# Patient Record
Sex: Male | Born: 1985 | Race: White | Hispanic: No | Marital: Married | Smoking: Current every day smoker
Health system: Southern US, Community
[De-identification: ages and names within clinical notes are randomized; demographics above are authoritative.]

## PROBLEM LIST (undated history)

## (undated) DIAGNOSIS — F32A Depression, unspecified: Secondary | ICD-10-CM

## (undated) DIAGNOSIS — J45909 Unspecified asthma, uncomplicated: Secondary | ICD-10-CM

## (undated) DIAGNOSIS — F329 Major depressive disorder, single episode, unspecified: Secondary | ICD-10-CM

## (undated) DIAGNOSIS — F419 Anxiety disorder, unspecified: Secondary | ICD-10-CM

## (undated) DIAGNOSIS — K219 Gastro-esophageal reflux disease without esophagitis: Secondary | ICD-10-CM

## (undated) HISTORY — DX: Unspecified asthma, uncomplicated: J45.909

## (undated) HISTORY — DX: Major depressive disorder, single episode, unspecified: F32.9

## (undated) HISTORY — DX: Anxiety disorder, unspecified: F41.9

## (undated) HISTORY — DX: Gastro-esophageal reflux disease without esophagitis: K21.9

## (undated) HISTORY — DX: Depression, unspecified: F32.A

---
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
## 2005-02-21 ENCOUNTER — Emergency Department: Payer: Self-pay | Admitting: Emergency Medicine

## 2005-06-19 ENCOUNTER — Emergency Department: Payer: Self-pay | Admitting: General Practice

## 2005-11-19 ENCOUNTER — Emergency Department: Payer: Self-pay | Admitting: Unknown Physician Specialty

## 2006-01-10 ENCOUNTER — Emergency Department: Payer: Self-pay | Admitting: Unknown Physician Specialty

## 2006-03-23 ENCOUNTER — Emergency Department: Payer: Self-pay | Admitting: General Practice

## 2006-09-15 ENCOUNTER — Emergency Department: Payer: Self-pay | Admitting: Internal Medicine

## 2007-03-14 ENCOUNTER — Emergency Department: Payer: Self-pay | Admitting: Emergency Medicine

## 2007-03-19 ENCOUNTER — Emergency Department: Payer: Self-pay | Admitting: Emergency Medicine

## 2008-06-22 ENCOUNTER — Emergency Department: Payer: Self-pay | Admitting: Emergency Medicine

## 2008-07-03 ENCOUNTER — Emergency Department: Payer: Self-pay | Admitting: Emergency Medicine

## 2009-04-29 ENCOUNTER — Emergency Department: Payer: Self-pay | Admitting: Emergency Medicine

## 2011-04-04 ENCOUNTER — Emergency Department: Payer: Self-pay | Admitting: Emergency Medicine

## 2012-06-21 ENCOUNTER — Emergency Department: Payer: Self-pay | Admitting: Emergency Medicine

## 2012-09-09 ENCOUNTER — Emergency Department: Payer: Self-pay | Admitting: Emergency Medicine

## 2013-05-06 ENCOUNTER — Emergency Department: Payer: Self-pay | Admitting: Emergency Medicine

## 2015-05-06 ENCOUNTER — Encounter: Payer: Self-pay | Admitting: Family Medicine

## 2015-05-06 ENCOUNTER — Ambulatory Visit
Admission: RE | Admit: 2015-05-06 | Discharge: 2015-05-06 | Disposition: A | Discharge status: Home / Self Care | Payer: No Typology Code available for payment source | Source: Ambulatory Visit | Attending: Family Medicine | Admitting: Family Medicine

## 2015-05-06 ENCOUNTER — Encounter (INDEPENDENT_AMBULATORY_CARE_PROVIDER_SITE_OTHER): Payer: Self-pay

## 2015-05-06 ENCOUNTER — Ambulatory Visit (INDEPENDENT_AMBULATORY_CARE_PROVIDER_SITE_OTHER): Payer: No Typology Code available for payment source | Admitting: Family Medicine

## 2015-05-06 VITALS — BP 116/66 | HR 84 | Temp 98.1°F | Resp 16 | Ht 71.0 in | Wt 175.2 lb

## 2015-05-06 DIAGNOSIS — M545 Low back pain, unspecified: Secondary | ICD-10-CM | POA: Insufficient documentation

## 2015-05-06 DIAGNOSIS — M5136 Other intervertebral disc degeneration, lumbar region: Secondary | ICD-10-CM | POA: Diagnosis not present

## 2015-05-06 DIAGNOSIS — J45909 Unspecified asthma, uncomplicated: Secondary | ICD-10-CM | POA: Insufficient documentation

## 2015-05-06 DIAGNOSIS — R41840 Attention and concentration deficit: Secondary | ICD-10-CM

## 2015-05-06 DIAGNOSIS — Z720 Tobacco use: Secondary | ICD-10-CM | POA: Insufficient documentation

## 2015-05-06 MED ORDER — CARISOPRODOL 350 MG PO TABS: 350.0000 mg | ORAL_TABLET | Freq: Every evening | ORAL | Status: DC | PRN

## 2015-05-06 MED ORDER — MELOXICAM 15 MG PO TABS: 15.0000 mg | ORAL_TABLET | Freq: Every day | ORAL | Status: DC

## 2015-05-06 NOTE — Progress Notes (Signed)
Name: Larry Bailey   MRN: [id number]    DOB: 1986-07-29   Date:05/06/2015       Progress Note  Subjective  Chief Complaint  Chief Complaint  Patient presents with  . Establish Care    patient states that his boss told him he was having a hard time following instructions.  . Back Pain    patient hurt his back about a month or so ago at work and he is still having some problems with it.     HPI  Joint/Muscle Pain: Patient complains of back pain for which has been present for a few months. Pain is located in mid to lower, is described as aching, and is intermittent .  Associated symptoms include: none.  The patient has NSAID and flexeril.  Related to injury:   Lifting heavy tires at work about 2 months ago, felt a twinge in his lower back right side more and since then having pain daily. No radiation, no weakness. Has not had any X-ray.   Focus Issues:  The condition is characterized as fidgeting, loses items necessary for activity, interrupting others, poor attention span, shifting from one uncompleted task to another and easily distracted but not excessive talking, difficulty remaining seated, difficulty waiting for his turn, engages in physically dangerous activities, difficulty waiting for his tern, blurting out answers before question is complete, difficulty following instructions or antisocial behavior. There has been no associated hearing difficulties, vision disturbances but does have a positive history of learning disability as a child. He has also had a hard time keeping jobs due to focus.     Patient Active Problem List   Diagnosis Date Noted  . 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 Current tobacco use 05/06/2015    History  Substance Use Topics  . Smoking status: Current Some Day Smoker    Types: Cigarettes  . Smokeless tobacco: Not on file  . Alcohol Use: No     Current outpatient prescriptions:  .  Acetaminophen 500 MG coapsule, Take by mouth., Disp: , Rfl:  .   cyclobenzaprine (FLEXERIL) 5 MG tablet, , Disp: , Rfl:  .  etodolac (LODINE) 400 MG tablet, , Disp: , Rfl:   History reviewed. No pertinent past surgical history.  Family History  Problem Relation Age of Onset  . Kidney disease Maternal Grandmother     kidney failure  . Diabetes Maternal Grandfather   . Cancer Maternal Grandfather     No Known Allergies   Review of Systems  CONSTITUTIONAL: No significant weight changes, fever, chills, weakness or fatigue.  HEENT:  - Eyes: No visual changes.  - Ears: No auditory changes. No pain.  - Nose: No sneezing, congestion, runny nose. - Throat: No sore throat. No changes in swallowing. SKIN: No rash or itching.  CARDIOVASCULAR: No chest pain, chest pressure or chest discomfort. No palpitations or edema.  RESPIRATORY: No shortness of breath, cough or sputum.  GASTROINTESTINAL: No anorexia, nausea, vomiting. No changes in bowel habits. No abdominal pain or blood.  GENITOURINARY: No dysuria. No frequency. No discharge. NEUROLOGICAL: No headache, dizziness, syncope, paralysis, ataxia, numbness or tingling in the extremities. No memory changes. No change in bowel or bladder control.  MUSCULOSKELETAL: Yes back pain. No muscle pain. HEMATOLOGIC: No anemia, bleeding or bruising.  LYMPHATICS: No enlarged lymph nodes.  PSYCHIATRIC: No change in mood. No change in sleep pattern. Problems with focus. ENDOCRINOLOGIC: No reports of sweating, cold or heat intolerance. No polyuria or polydipsia.  Objective  BP 116/66 mmHg  Pulse 84  Temp(Src) 98.1 F (36.7 C) (Oral)  Resp 16  Ht  (1.803 m)  Wt 175 lb 3.2 oz (79.47 kg)  BMI 24.45 kg/m2  SpO2 98% Body mass index is 24.45 kg/(m^2).  Physical Exam  Constitutional: Patient appears well-developed and well-nourished. In no distress.  HEENT:  - Head: Normocephalic and atraumatic.  - Ears: Bilateral TMs gray, no erythema or effusion - Nose: Nasal mucosa moist - Mouth/Throat:  Oropharynx is clear and moist. No tonsillar hypertrophy or erythema. No post nasal drainage.  - Eyes: Conjunctivae clear, EOM movements normal. PERRLA. No scleral icterus.  Neck: Normal range of motion. Neck supple. No JVD present. No thyromegaly present.  Cardiovascular: Normal rate, regular rhythm and normal heart sounds.  No murmur heard.  Pulmonary/Chest: Effort normal and breath sounds normal. No respiratory distress. Musculoskeletal: Normal range of motion bilateral UE and LE, no joint effusions. Lumbar spin no palpable step off or tenderness over spine however he does have right paraspinal muscle tension and tenderness. Peripheral vascular: Bilateral LE no edema. Neurological: CN II-XII grossly intact with no focal deficits. Alert and oriented to person, place, and time. Coordination, balance, strength, speech and gait are normal.  Skin: Skin is warm and dry. No rash noted. No erythema.  Psychiatric: Patient has a normal mood and affect. Behavior is normal in office today. Judgment and thought content normal in office today.   Assessment & Plan 1. Right-sided low back pain without sciatica Likely muscular. Instructed on home exercises and proper lifting techniques.   - DG Lumbar Spine Complete; Future - meloxicam (MOBIC) 15 MG tablet; Take 1 tablet (15 mg total) by mouth daily.  Dispense: 30 tablet; Refill: 1 - carisoprodol (SOMA) 350 MG tablet; Take 1 tablet (350 mg total) by mouth at bedtime as needed for muscle spasms.  Dispense: 30 tablet; Refill: 1  2. Attention deficit Will refer for appropriate evaluation of likely undiagnosed life long ADD. - Ambulatory referral to Psychology

## 2015-05-06 NOTE — Patient Instructions (Signed)

## 2015-05-07 ENCOUNTER — Encounter: Payer: Self-pay | Admitting: Family Medicine

## 2015-08-26 ENCOUNTER — Encounter: Payer: Self-pay | Admitting: Family Medicine

## 2015-08-26 ENCOUNTER — Ambulatory Visit (INDEPENDENT_AMBULATORY_CARE_PROVIDER_SITE_OTHER): Payer: Self-pay | Admitting: Family Medicine

## 2015-08-26 VITALS — BP 118/67 | HR 88 | Temp 98.1°F | Resp 18 | Ht 71.0 in | Wt 177.3 lb

## 2015-08-26 DIAGNOSIS — M545 Low back pain: Secondary | ICD-10-CM

## 2015-08-26 DIAGNOSIS — G8929 Other chronic pain: Secondary | ICD-10-CM

## 2015-08-26 MED ORDER — METAXALONE 800 MG PO TABS: 800.0000 mg | ORAL_TABLET | Freq: Three times a day (TID) | ORAL | Status: DC

## 2015-08-26 NOTE — Progress Notes (Signed)
Name: Larry Bailey   MRN: [id number]    DOB: Sep 01, 1986   Date:08/26/2015       Progress Note  Subjective  Chief Complaint  Chief Complaint  Patient presents with  . Back Pain    Right Side x6 months    Back Pain This is a chronic problem. Episode onset: 6 months ago. The problem occurs constantly. The problem has been waxing and waning (initially pain would come and go but for last 3-4 months, it is constant) since onset. The pain is present in the lumbar spine and gluteal. The pain does not radiate. The pain is at a severity of 10/10. The pain is severe. The symptoms are aggravated by standing, position and bending. Pertinent negatives include no bladder incontinence, bowel incontinence, fever, leg pain, numbness, paresis, tingling or weight loss. He has tried NSAIDs and muscle relaxant for the symptoms. The treatment provided no relief.    Past Medical History  Diagnosis Date  . Anxiety   . Asthma   . Depression   . GERD (gastroesophageal reflux disease)     History reviewed. No pertinent past surgical history.  Family History  Problem Relation Age of Onset  . Kidney disease Maternal Grandmother     kidney failure  . Diabetes Maternal Grandfather   . Cancer Maternal Grandfather     Social History   Social History  . Marital Status: Single    Spouse Name: N/A  . Number of Children: 0  . Years of Education: N/A   Occupational History  . 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 Smoking status: Current Some Day Smoker    Types: Cigarettes  . Smokeless tobacco: Not on file  . Alcohol Use: No  . Drug Use: No  . Sexual Activity:    Partners: Female    CopyBirth Control/ Protection: None   Other Topics Concern  . Not on file   Social History Narrative    Current outpatient prescriptions:  .  Acetaminophen 500 MG coapsule, Take by mouth., Disp: , Rfl:  .  carisoprodol (SOMA) 350 MG tablet, Take 1 tablet (350 mg total) by mouth at bedtime as  needed for muscle spasms., Disp: 30 tablet, Rfl: 1 .  cyclobenzaprine (FLEXERIL) 5 MG tablet, , Disp: , Rfl:  .  etodolac (LODINE) 400 MG tablet, , Disp: , Rfl:  .  meloxicam (MOBIC) 15 MG tablet, Take 1 tablet (15 mg total) by mouth daily. (Patient not taking: Reported on 08/26/2015), Disp: 30 tablet, Rfl: 1  No Known Allergies   Review of Systems  Constitutional: Negative for fever, chills, weight loss and malaise/fatigue.  Gastrointestinal: Negative for bowel incontinence.  Genitourinary: Negative for bladder incontinence.  Musculoskeletal: Positive for back pain.  Neurological: Negative for tingling and numbness.    Objective  Filed Vitals:   08/26/15 1002  BP: 118/67  Pulse: 88  Temp: 98.1 F (36.7 C)  TempSrc: Oral  Resp: 18  Height: 5\' 11"  (1.803 m)  Weight: 177 lb 4.8 oz (80.423 kg)  SpO2: 97%    Physical Exam  Constitutional: He is well-developed, well-nourished, and in no distress.  Cardiovascular: Normal rate, regular rhythm and normal heart sounds.   Pulmonary/Chest: Effort normal and breath sounds normal.  Musculoskeletal:       Lumbar back: He exhibits tenderness, pain and spasm.  Nursing note and vitals reviewed.   Assessment & Plan  1. Chronic right-sided low back pain without sciatica  Right-sided lumbosacral pain and spasm, unresponsive to NSAID therapy. X-rays reviewed. We'll start on Skelaxin 800 mg 3 times a day for relief and refer to spine specialist. - metaxalone (SKELAXIN) 800 MG tablet; Take 1 tablet (800 mg total) by mouth 3 (three) times daily.  Dispense: 30 tablet; Refill: 0 - Ambulatory referral to Neurosurgery   [facility] 08/26/2015 10:14 AM

## 2017-01-03 ENCOUNTER — Encounter: Payer: Self-pay | Admitting: Family Medicine

## 2017-01-03 ENCOUNTER — Ambulatory Visit (INDEPENDENT_AMBULATORY_CARE_PROVIDER_SITE_OTHER): Payer: Managed Care, Other (non HMO) | Admitting: Family Medicine

## 2017-01-03 VITALS — BP 124/76 | HR 100 | Temp 97.7°F | Resp 16 | Ht 71.0 in | Wt 190.3 lb

## 2017-01-03 DIAGNOSIS — G8929 Other chronic pain: Secondary | ICD-10-CM | POA: Diagnosis not present

## 2017-01-03 DIAGNOSIS — M545 Low back pain, unspecified: Secondary | ICD-10-CM

## 2017-01-03 MED ORDER — NAPROXEN 500 MG PO TABS: 500.0000 mg | ORAL_TABLET | Freq: Two times a day (BID) | ORAL | 0 refills | Status: DC

## 2017-01-03 MED ORDER — TIZANIDINE HCL 2 MG PO TABS: 2.0000 mg | ORAL_TABLET | Freq: Three times a day (TID) | ORAL | 0 refills | Status: DC | PRN

## 2017-01-03 NOTE — Progress Notes (Signed)
Name: Larry Bailey   MRN: [id number]    DOB: [birth year]   Date:01/03/2017       Progress Note  Subjective  Chief Complaint  Chief Complaint  Patient presents with  . Back Pain    recurrent low back pain    Back Pain  This is a recurrent problem. The current episode started more than 1 year ago (aproximately 2 years ago, believes he was hurt at work while picking up Intelaheavy object, since then has recurrent lower back pain). The pain is present in the lumbar spine. The quality of the pain is described as shooting. The pain does not radiate. The pain is at a severity of 9/10. The symptoms are aggravated by position and standing (prolonged standing makes it worse). Pertinent negatives include no abdominal pain, bladder incontinence, bowel incontinence, leg pain, numbness, perianal numbness or weakness. He has tried muscle relaxant and NSAIDs (referred to a back specialist but was not able to see them due to work) for the symptoms. The treatment provided no relief.    Past Medical History:  Diagnosis Date  . Anxiety   . Asthma   . Depression   . GERD (gastroesophageal reflux disease)     No past surgical history on file.  Family History  Problem Relation Age of Onset  . Kidney disease Maternal Grandmother     kidney failure  . Diabetes Maternal Grandfather   . Cancer Maternal Grandfather     Social History   Social History  . Marital status: Single    Spouse name: N/A  . Number of children: 0  . Years of education: N/A   Occupational History  . 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 Smoking status: Current Every Day Smoker    Types: Cigarettes  . Smokeless tobacco: Never Used  . Alcohol use No  . Drug use: No  . Sexual activity: Yes    Partners: Female    Birth control/ protection: None   Other Topics Concern  . Not on file   Social History Narrative  . No narrative on file    No current outpatient prescriptions on file.  No Known  Allergies   Review of Systems  Gastrointestinal: Negative for abdominal pain and bowel incontinence.  Genitourinary: Negative for bladder incontinence.  Musculoskeletal: Positive for back pain.  Neurological: Negative for weakness and numbness.     Objective  Vitals:   01/03/17 0951  BP: 124/76  Pulse: 100  Resp: 16  Temp: 97.7 F (36.5 C)  TempSrc: Oral  SpO2: 97%  Weight: 190 lb 4.8 oz (86.3 kg)  Height: 5\' 11"  (1.803 m)    Physical Exam  Constitutional: He is well-developed, well-nourished, and in no distress.  Musculoskeletal:       Lumbar back: He exhibits tenderness, pain and spasm.       Back:  Nursing note and vitals reviewed.     Assessment & Plan  1. Chronic midline low back pain without sciatica Started on high-dose NSAID therapy along with muscle relaxants, obtain repeat lumbar spine x-ray, referral to physical therapy for management - naproxen (NAPROSYN) 500 MG tablet; Take 1 tablet (500 mg total) by mouth 2 (two) times daily with a meal.  Dispense: 20 tablet; Refill: 0 - tiZANidine (ZANAFLEX) 2 MG tablet; Take 1 tablet (2 mg total) by mouth every 8 (eight) hours as needed for muscle spasms.  Dispense: 30 tablet; Refill: 0 - DG Lumbar  Spine Complete; Future - Ambulatory referral to Physical Therapy   Mylen Mangan [facility] 01/03/2017 10:07 AM

## 2017-02-09 ENCOUNTER — Ambulatory Visit (INDEPENDENT_AMBULATORY_CARE_PROVIDER_SITE_OTHER): Payer: Managed Care, Other (non HMO) | Admitting: Family Medicine

## 2017-02-09 ENCOUNTER — Encounter: Payer: Self-pay | Admitting: Family Medicine

## 2017-02-09 VITALS — BP 124/82 | HR 102 | Temp 98.0°F | Resp 18 | Ht 71.0 in | Wt 187.2 lb

## 2017-02-09 DIAGNOSIS — Z0000 Encounter for general adult medical examination without abnormal findings: Secondary | ICD-10-CM | POA: Diagnosis not present

## 2017-02-09 DIAGNOSIS — Z720 Tobacco use: Secondary | ICD-10-CM | POA: Diagnosis not present

## 2017-02-09 NOTE — Progress Notes (Signed)
Name: Larry Bailey   MRN: [id number]    DOB: 06-Nov-1985   Date:02/09/2017       Progress Note  Subjective  Chief Complaint  Chief Complaint  Patient presents with  . Annual Exam    Nicotine Dependence  Presents for initial visit. Symptoms include cravings. Symptoms are negative for insomnia and sore throat. Preferred tobacco types include cigarettes. Preferred strength is regular. Preferred brands include Marlboro. His urge triggers include company of smokers, driving, meal time and stress. His first smoke is from 8 to 10 AM. He smokes 1 pack of cigarettes per day. He started smoking when he was 80-87 years old. Past treatments include nothing. Compliance with prior treatments has been good. Larry Bailey is thinking about quitting. Larry Bailey has tried to quit 0 times. There is no history of alcohol abuse and drug use.    Pt. Presents for Complete Physical Exam.    Past Medical History:  Diagnosis Date  . Anxiety   . Asthma   . Depression   . GERD (gastroesophageal reflux disease)     No past surgical history on file.  Family History  Problem Relation Age of Onset  . Kidney disease Maternal Grandmother     kidney failure  . Diabetes Maternal Grandfather   . Cancer Maternal Grandfather     Social History   Social History  . Marital status: Single    Spouse name: N/A  . Number of children: 0  . Years of education: N/A   Occupational History  . Lube Tech     [employer]   Social History Main Topics  . Smoking status: Current Every Day Smoker    Types: Cigarettes  . Smokeless tobacco: Never Used  . Alcohol use No  . Drug use: No  . Sexual activity: Yes    Partners: Female    Birth control/ protection: None   Other Topics Concern  . Not on file   Social History Narrative  . No narrative on file     Current Outpatient Prescriptions:  .  naproxen (NAPROSYN) 500 MG tablet, Take 1 tablet (500 mg total) by mouth 2 (two) times daily with a meal., Disp: 20 tablet,  Rfl: 0 .  tiZANidine (ZANAFLEX) 2 MG tablet, Take 1 tablet (2 mg total) by mouth every 8 (eight) hours as needed for muscle spasms., Disp: 30 tablet, Rfl: 0  No Known Allergies   Review of Systems  Constitutional: Negative for chills, fever, malaise/fatigue and weight loss.  HENT: Negative for congestion, ear pain and sore throat.   Eyes: Negative for blurred vision and double vision.  Respiratory: Positive for cough. Negative for sputum production and shortness of breath.   Cardiovascular: Negative for chest pain.  Gastrointestinal: Negative for blood in stool, constipation, diarrhea, melena, nausea and vomiting.  Genitourinary: Negative for dysuria and hematuria.  Musculoskeletal: Positive for back pain.  Neurological: Negative for dizziness and headaches.  Psychiatric/Behavioral: Negative for depression. The patient is not nervous/anxious and does not have insomnia.     Objective  Vitals:   02/09/17 0913  BP: 124/82  Pulse: (!) 102  Resp: 18  Temp: 98 F (36.7 C)  TempSrc: Oral  SpO2: 99%  Weight: 187 lb 3.2 oz (84.9 kg)  Height:  (1.803 m)    Physical Exam  Constitutional: He is oriented to person, place, and time and well-developed, well-nourished, and in no distress.  HENT:  Head: Normocephalic and atraumatic.  Right Ear: External ear normal.  Left Ear:  External ear normal.  Eyes: Pupils are equal, round, and reactive to light.  Cardiovascular: Normal rate, regular rhythm and normal heart sounds.   No murmur heard. Pulmonary/Chest: Effort normal. He has no wheezes.  Abdominal: Soft. Bowel sounds are normal. There is no tenderness.  Musculoskeletal: He exhibits no edema.  Neurological: He is alert and oriented to person, place, and time.  Psychiatric: Mood, memory, affect and judgment normal.  Nursing note and vitals reviewed.      Assessment & Plan  1. Annual physical exam We'll obtain age appropriate lab work - CBC with Differential/Platelet -  COMPLETE METABOLIC PANEL WITH GFR - Lipid panel - TSH - VITAMIN D 25 Hydroxy (Vit-D Deficiency, Fractures)  2. Current tobacco use Patient is contemplating quitting but is not ready at this time, we discussed nicotine replacement versus Zyban to help with smoking cessation, patient will schedule an appointment when he is ready.   Belanna Manring Larry Bailey A. [facility] 02/09/2017 9:17 AM

## 2017-02-10 LAB — CBC WITH DIFFERENTIAL/PLATELET
BASOS PCT: 1 %
Basophils Absolute: 59 cells/uL (ref 0–200)
EOS ABS: 236 {cells}/uL (ref 15–500)
Eosinophils Relative: 4 %
HCT: 51.5 % — ABNORMAL HIGH (ref 38.5–50.0)
Hemoglobin: 17.6 g/dL — ABNORMAL HIGH (ref 13.2–17.1)
LYMPHS ABS: 2596 {cells}/uL (ref 850–3900)
LYMPHS PCT: 44 %
MCH: 32.7 pg (ref 27.0–33.0)
MCHC: 34.2 g/dL (ref 32.0–36.0)
MCV: 95.7 fL (ref 80.0–100.0)
MONOS PCT: 10 %
MPV: 9.3 fL (ref 7.5–12.5)
Monocytes Absolute: 590 cells/uL (ref 200–950)
NEUTROS ABS: 2419 {cells}/uL (ref 1500–7800)
Neutrophils Relative %: 41 %
PLATELETS: 162 10*3/uL (ref 140–400)
RBC: 5.38 MIL/uL (ref 4.20–5.80)
RDW: 13.2 % (ref 11.0–15.0)
WBC: 5.9 10*3/uL (ref 3.8–10.8)

## 2017-02-10 LAB — LIPID PANEL
CHOLESTEROL: 117 mg/dL (ref <200)
HDL: 36 mg/dL — ABNORMAL LOW (ref >40)
LDL Cholesterol: 64 mg/dL (ref <100)
Total CHOL/HDL Ratio: 3.3 Ratio (ref <5.0)
Triglycerides: 83 mg/dL (ref <150)
VLDL: 17 mg/dL (ref <30)

## 2017-02-10 LAB — COMPLETE METABOLIC PANEL WITH GFR
AG Ratio: 2.2 Ratio (ref 1.0–2.5)
ALBUMIN: 4.3 g/dL (ref 3.6–5.1)
ALT: 42 U/L (ref 9–46)
AST: 35 U/L (ref 10–40)
Alkaline Phosphatase: 61 U/L (ref 40–115)
BILIRUBIN TOTAL: 0.8 mg/dL (ref 0.2–1.2)
BUN / CREAT RATIO: 11.5 ratio (ref 6–22)
BUN: 10 mg/dL (ref 7–25)
CALCIUM: 9.3 mg/dL (ref 8.6–10.3)
CHLORIDE: 104 mmol/L (ref 98–110)
CO2: 31 mmol/L (ref 20–31)
CREATININE: 0.87 mg/dL (ref 0.60–1.35)
GFR, Est African American: >89 mL/min (ref >=60)
GFR, Est Non African American: >89 mL/min (ref >=60)
GLOBULIN: 2 g/dL (ref 1.9–3.7)
Glucose, Bld: 95 mg/dL (ref 65–99)
Potassium: 5.1 mmol/L (ref 3.5–5.3)
Sodium: 140 mmol/L (ref 135–146)
Total Protein: 6.3 g/dL (ref 6.1–8.1)

## 2017-02-10 LAB — TSH: TSH: 2.91 mIU/L (ref 0.40–4.50)

## 2017-02-11 LAB — VITAMIN D 25 HYDROXY (VIT D DEFICIENCY, FRACTURES): Vit D, 25-Hydroxy: 41 ng/mL (ref 30–100)

## 2017-02-27 ENCOUNTER — Encounter: Payer: Self-pay | Admitting: Family Medicine

## 2020-09-22 ENCOUNTER — Other Ambulatory Visit: Payer: Self-pay

## 2020-09-22 NOTE — Progress Notes (Signed)
Pre-employment instant urine drug screen collected.  Negative.

## 2020-10-19 DIAGNOSIS — R69 Illness, unspecified: Secondary | ICD-10-CM | POA: Diagnosis not present

## 2020-11-27 ENCOUNTER — Ambulatory Visit: Payer: Self-pay

## 2020-11-27 ENCOUNTER — Other Ambulatory Visit: Payer: Self-pay

## 2020-11-27 DIAGNOSIS — Z0110 Encounter for examination of ears and hearing without abnormal findings: Secondary | ICD-10-CM

## 2020-12-01 NOTE — Progress Notes (Signed)
Works for [employer]. Department in [employer] program & works in a position that requires an annual hearing screen.  Larry Bailey is a new employee & today's test is to establish a baseline.  AMD

## 2020-12-03 DIAGNOSIS — R69 Illness, unspecified: Secondary | ICD-10-CM | POA: Diagnosis not present

## 2020-12-25 DIAGNOSIS — R69 Illness, unspecified: Secondary | ICD-10-CM | POA: Diagnosis not present

## 2021-01-26 DIAGNOSIS — R69 Illness, unspecified: Secondary | ICD-10-CM | POA: Diagnosis not present

## 2021-02-22 DIAGNOSIS — R69 Illness, unspecified: Secondary | ICD-10-CM | POA: Diagnosis not present

## 2021-03-22 DIAGNOSIS — R69 Illness, unspecified: Secondary | ICD-10-CM | POA: Diagnosis not present

## 2021-04-15 ENCOUNTER — Encounter: Payer: Managed Care, Other (non HMO) | Admitting: Unknown Physician Specialty

## 2021-04-20 DIAGNOSIS — R69 Illness, unspecified: Secondary | ICD-10-CM | POA: Diagnosis not present

## 2021-05-17 DIAGNOSIS — R69 Illness, unspecified: Secondary | ICD-10-CM | POA: Diagnosis not present

## 2021-06-17 DIAGNOSIS — R69 Illness, unspecified: Secondary | ICD-10-CM | POA: Diagnosis not present

## 2021-07-19 DIAGNOSIS — R69 Illness, unspecified: Secondary | ICD-10-CM | POA: Diagnosis not present

## 2021-08-19 DIAGNOSIS — R69 Illness, unspecified: Secondary | ICD-10-CM | POA: Diagnosis not present

## 2021-09-15 DIAGNOSIS — R69 Illness, unspecified: Secondary | ICD-10-CM | POA: Diagnosis not present

## 2021-10-06 DIAGNOSIS — R69 Illness, unspecified: Secondary | ICD-10-CM | POA: Diagnosis not present

## 2021-10-07 ENCOUNTER — Ambulatory Visit: Payer: Self-pay | Admitting: Physician Assistant

## 2021-10-07 ENCOUNTER — Encounter: Payer: Self-pay | Admitting: Physician Assistant

## 2021-10-07 ENCOUNTER — Other Ambulatory Visit: Payer: Self-pay

## 2021-10-07 DIAGNOSIS — M7712 Lateral epicondylitis, left elbow: Secondary | ICD-10-CM

## 2021-10-07 MED ORDER — NAPROXEN 500 MG PO TABS: 500.0000 mg | ORAL_TABLET | Freq: Two times a day (BID) | ORAL | 0 refills | Status: DC

## 2021-10-07 NOTE — Progress Notes (Signed)
Left Elbow pain x1 year - intermittent Started back a couple months ago No known injury Hasn't seen anyone for the elbow pain No OTC meds, heat or ice or topical meds tried Thinks there is a know there that's not on the right elbow. Gets stiff if keeps in one position for extended amount of time No numbness or tingling in fingers Pain localized to elbow area - no radiation towards shoulder or hands Repetitive work No sports like golf, tennis or softball  AMD

## 2021-10-07 NOTE — Progress Notes (Signed)
[facility]     ____________________________________________   None    (approximate)  I have reviewed the triage vital signs and the nursing notes.   HISTORY  Chief Complaint Elbow Pain (Left)    HPI Larry Bailey is a 35 y.o. male patient complain of 1 year lateral elbow pain.  No provocative significant except for repetitive motion required by his job.  Denies loss of sensation.  Patient pain increases rates pain as 5/10.  Described pain as "patient states similar complaint in right dominant elbow few years ago.      Past Medical History:  Diagnosis Date   Anxiety    Asthma    Depression    GERD (gastroesophageal reflux disease)     Patient Active Problem List   Diagnosis Date Noted   Annual physical exam 02/09/2017   Chronic midline low back pain without sciatica 08/26/2015   Airway hyperreactivity 05/06/2015   Current tobacco use 05/06/2015   Lumbar back pain 05/06/2015   Attention deficit 05/06/2015    History reviewed. No pertinent surgical history.  Prior to Admission medications   Medication Sig Start Date End Date Taking? Authorizing Provider  buprenorphine-naloxone (SUBOXONE) 8-2 mg SUBL SL tablet 1 tablet 2 (two) times daily. 09/27/21  Yes [provider]  naproxen (NAPROSYN) 500 MG tablet Take 1 tablet (500 mg total) by mouth 2 (two) times daily with a meal. 01/03/17   Ellyn Hack, MD  tiZANidine (ZANAFLEX) 2 MG tablet Take 1 tablet (2 mg total) by mouth every 8 (eight) hours as needed for muscle spasms. 01/03/17   Ellyn Hack, MD    Allergies Patient has no known allergies.  Family History  Problem Relation Age of Onset   Kidney disease Maternal Grandmother        kidney failure   Diabetes Maternal Grandfather    Cancer Maternal Grandfather     Social History Social History   Tobacco Use   Smoking status: Every Day    Types: Cigarettes   Smokeless tobacco: Never  Substance Use Topics   Alcohol use: No     Alcohol/week: 0.0 standard drinks   Drug use: No    Review of Systems Constitutional: No fever/chills Eyes: No visual changes. ENT: No sore throat. Cardiovascular: Denies chest pain. Respiratory: Denies shortness of breath. Gastrointestinal: No abdominal pain.  No nausea, no vomiting.  No diarrhea.  No constipation. Genitourinary: Negative for dysuria. Musculoskeletal: Negative for back pain. Skin: Negative for rash. Neurological: Negative for headaches, focal weakness or numbness. Psychiatric: Anxiety and depression ____________________________________________   PHYSICAL EXAM:  VITAL SIGNS:  Today's Problems  Elbow Pain Left    Select Method of Visit   Review Patient History   Go to Quick History Navigator   Today's Vitals  Date and Time Temp Pulse RR BP SpO2 Weight BMI Who  10/07/21 0852 98 F (36.7 C) 74 12 130/82 98 % 214 lb (97.1 kg) 29.8 AMD    See historical vitals   Today's Labs & Orders  None   Medications           buprenorphine-naloxone (SUBOXONE) 8-2 mg SUBL SL tablet 1 tablet 2 (two) times daily.  naproxen (NAPROSYN) 500 MG tablet Take 1 tablet (500 mg total) by mouth 2 (two) times daily with a meal.  tiZANidine (ZANAFLEX) 2 MG tablet Take 1 tablet (2 mg total) by mouth every 8 (eight) hours as needed for muscle spasms.     Mark as Reviewed  Reviewed by Gardner Candle, RN at 9:02 AM EST  Jump to medication reconciliation   Health Maintenance due or due soon     Topic Due Completion Date  COVID-19 Vaccine (1) Never done ---  Pneumococcal Vaccine 72-44 Years old (1 - PCV) Never done ---  HIV Screening Never done ---  Hepatitis C Screening Never done ---  TETANUS/TDAP Never done ---  INFLUENZA VACCINE Never done ---        BestPractice Advisories  Influenza vaccine due. Order the immunization through the SmartSet, document historical immunizations in the Immunizations activity, use Postpone to document a reason for not immunizing today,  or add the exclusion modifier to permanently remove from influenza plan.    If patient allergic to the vaccine, use hyperlink below to document allergies and add HM modifier "Not a candidate for influenza".  If patient declines or is ill use Postpone to set new date for vaccination reminder. To decline for the season, select Postpone and set date to 01/14/2022(end date of Influenza Plan).   Additional Information Follow-ups   Immunizations - Document Historical Immunization   CDC Immunization Guidelines Open SmartSet: Influenza Vaccine HM postpone: INFLUENZA VACCINE Add HM modifier: Not a candidate for influenza vaccine          Current tobacco user: with no documented counseling in past two years  Metrics: Intervention Frequency ACO  Documented Smoking Status Yearly  Screened one or more times in 24 months  Cessation Counseling or  Active cessation medication Past 24 months  Past 24 months    Guideline developer: UpToDate (See UpToDate for funding source) Date Released: 2014     Additional Information Follow-ups   History activity to update smoking history   Meds and Orders   UpToDate Smoking Cessation Open SmartSet: SMOKING CESSATION Initiate: Quit smoking          This patient is 18+ with a BMI > or = 25 indicating the patient is overweight. Please document your clinical recommendations and intended follow up now.    Estimated body mass index is 29.85 kg/m as calculated from the following:   Height as of this encounter: 5\' 11"  (1.803 m).   Weight as of this encounter: 214 lb (97.1 kg).     Additional Information Follow-ups   Document Follow Up Option/Contraindication Open SmartSet: [facility] AMB Qual Weight Management       Constitutional: Alert and oriented. Well appearing and in no acute distress. Cardiovascular: Normal rate, regular rhythm. Grossly normal heart sounds.  Good peripheral circulation. Respiratory: Normal respiratory effort.  No retractions. Lungs  CTAB. Musculoskeletal: No obvious deformity of the left elbow.  Patient full Nikkel range of motion.  Patient has moderate guarding palpation of the lateral epicondyle area. Neurologic:  Normal speech and language. No gross focal neurologic deficits are appreciated. No gait instability. Skin:  Skin is warm, dry and intact. No rash noted. Psychiatric: Mood and affect are normal. Speech and behavior are normal.  ____________________________________________      ____________________________________________   PROCEDURES  Procedure(s) performed (including Critical Care):  Procedures   ____________________________________________   INITIAL IMPRESSION / ASSESSMENT AND PLAN  Left elbow pain secondary to lateral epicondylitis.  Discussed sequela of complaint.  Patient placed in a tennis elbow strap and given a prescription for naproxen to take twice a day for 10 days.  Patient advised follow-up in 2 weeks if no improvement or worsening complaint.             ____________________________________________  ED Discharge Orders     None        Note:  This document was prepared using Dragon voice recognition software and may include unintentional dictation errors.

## 2021-11-02 DIAGNOSIS — R69 Illness, unspecified: Secondary | ICD-10-CM | POA: Diagnosis not present

## 2021-11-30 DIAGNOSIS — R69 Illness, unspecified: Secondary | ICD-10-CM | POA: Diagnosis not present

## 2021-12-23 DIAGNOSIS — R69 Illness, unspecified: Secondary | ICD-10-CM | POA: Diagnosis not present

## 2022-01-27 DIAGNOSIS — R69 Illness, unspecified: Secondary | ICD-10-CM | POA: Diagnosis not present

## 2022-02-23 DIAGNOSIS — R69 Illness, unspecified: Secondary | ICD-10-CM | POA: Diagnosis not present

## 2022-03-02 ENCOUNTER — Encounter: Payer: Self-pay | Admitting: Physician Assistant

## 2022-03-02 ENCOUNTER — Ambulatory Visit
Admission: RE | Admit: 2022-03-02 | Discharge: 2022-03-02 | Disposition: A | Discharge status: Home / Self Care | Payer: No Typology Code available for payment source | Source: Ambulatory Visit | Attending: Physician Assistant | Admitting: Physician Assistant

## 2022-03-02 ENCOUNTER — Ambulatory Visit
Admission: RE | Admit: 2022-03-02 | Discharge: 2022-03-02 | Disposition: A | Discharge status: Home / Self Care | Payer: No Typology Code available for payment source | Attending: Physician Assistant | Admitting: Physician Assistant

## 2022-03-02 ENCOUNTER — Ambulatory Visit: Payer: Self-pay | Admitting: Physician Assistant

## 2022-03-02 VITALS — BP 149/92 | HR 70 | Temp 99.5°F | Resp 16 | Ht 71.0 in | Wt 200.0 lb

## 2022-03-02 DIAGNOSIS — M7712 Lateral epicondylitis, left elbow: Secondary | ICD-10-CM

## 2022-03-02 DIAGNOSIS — M79641 Pain in right hand: Secondary | ICD-10-CM | POA: Insufficient documentation

## 2022-03-02 MED ORDER — NAPROXEN 500 MG PO TABS: 500.0000 mg | ORAL_TABLET | Freq: Two times a day (BID) | ORAL | 0 refills | Status: DC
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
## 2022-03-03 ENCOUNTER — Ambulatory Visit: Payer: Self-pay | Admitting: Physician Assistant

## 2022-03-03 ENCOUNTER — Encounter: Payer: Self-pay | Admitting: Physician Assistant

## 2022-03-03 VITALS — BP 146/83 | HR 71 | Temp 98.9°F | Resp 16

## 2022-03-03 DIAGNOSIS — S63621D Sprain of interphalangeal joint of right thumb, subsequent encounter: Secondary | ICD-10-CM

## 2022-03-03 NOTE — Progress Notes (Signed)
States Right thumb/hand some better today but still sore  AMD
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
## 2022-03-07 ENCOUNTER — Ambulatory Visit: Payer: Self-pay | Admitting: Physician Assistant

## 2022-03-07 ENCOUNTER — Encounter: Payer: Self-pay | Admitting: Physician Assistant

## 2022-03-07 VITALS — BP 129/94 | HR 74 | Resp 14 | Ht 71.0 in | Wt 205.0 lb

## 2022-03-07 DIAGNOSIS — S63621D Sprain of interphalangeal joint of right thumb, subsequent encounter: Secondary | ICD-10-CM

## 2022-03-07 NOTE — Progress Notes (Signed)
Pt presents today for follow up on right hand. Pt states its feeling better.
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
## 2022-03-23 ENCOUNTER — Ambulatory Visit: Payer: Self-pay

## 2022-03-23 DIAGNOSIS — Z0000 Encounter for general adult medical examination without abnormal findings: Secondary | ICD-10-CM

## 2022-03-23 LAB — POCT URINALYSIS DIPSTICK
Bilirubin, UA: NEGATIVE
Blood, UA: NEGATIVE
Glucose, UA: NEGATIVE
Ketones, UA: NEGATIVE
Leukocytes, UA: NEGATIVE
Nitrite, UA: NEGATIVE
Protein, UA: POSITIVE — AB
Spec Grav, UA: 1.02 (ref 1.010–1.025)
Urobilinogen, UA: 0.2 E.U./dL
pH, UA: 6.5 (ref 5.0–8.0)

## 2022-03-23 NOTE — Progress Notes (Signed)
Pt presents today for physical labs, will return to clinic for scheduled physical.  

## 2022-03-24 LAB — CMP12+LP+TP+TSH+6AC+CBC/D/PLT
ALT: 47 IU/L — ABNORMAL HIGH (ref 0–44)
AST: 35 IU/L (ref 0–40)
Albumin/Globulin Ratio: 2.3 — ABNORMAL HIGH (ref 1.2–2.2)
Albumin: 5.1 g/dL — ABNORMAL HIGH (ref 4.0–5.0)
Alkaline Phosphatase: 85 IU/L (ref 44–121)
BUN/Creatinine Ratio: 20 (ref 9–20)
BUN: 16 mg/dL (ref 6–20)
Basophils Absolute: 0.1 10*3/uL (ref 0.0–0.2)
Basos: 1 %
Bilirubin Total: 2.1 mg/dL — ABNORMAL HIGH (ref 0.0–1.2)
Calcium: 10 mg/dL (ref 8.7–10.2)
Chloride: 99 mmol/L (ref 96–106)
Chol/HDL Ratio: 5 ratio (ref 0.0–5.0)
Cholesterol, Total: 165 mg/dL (ref 100–199)
Creatinine, Ser: 0.81 mg/dL (ref 0.76–1.27)
EOS (ABSOLUTE): 0.5 10*3/uL — ABNORMAL HIGH (ref 0.0–0.4)
Eos: 6 %
Estimated CHD Risk: 1 times avg. (ref 0.0–1.0)
Free Thyroxine Index: 2.1 (ref 1.2–4.9)
GGT: 103 IU/L — ABNORMAL HIGH (ref 0–65)
Globulin, Total: 2.2 g/dL (ref 1.5–4.5)
Glucose: 81 mg/dL (ref 70–99)
HDL: 33 mg/dL — ABNORMAL LOW (ref >39)
Hematocrit: 50.7 % (ref 37.5–51.0)
Hemoglobin: 17.7 g/dL (ref 13.0–17.7)
Immature Grans (Abs): 0 10*3/uL (ref 0.0–0.1)
Immature Granulocytes: 0 %
Iron: 228 ug/dL — ABNORMAL HIGH (ref 38–169)
LDH: 267 IU/L — ABNORMAL HIGH (ref 121–224)
LDL Chol Calc (NIH): 97 mg/dL (ref 0–99)
Lymphocytes Absolute: 3.5 10*3/uL — ABNORMAL HIGH (ref 0.7–3.1)
Lymphs: 47 %
MCH: 32.5 pg (ref 26.6–33.0)
MCHC: 34.9 g/dL (ref 31.5–35.7)
MCV: 93 fL (ref 79–97)
Monocytes Absolute: 0.6 10*3/uL (ref 0.1–0.9)
Monocytes: 8 %
Neutrophils Absolute: 2.9 10*3/uL (ref 1.4–7.0)
Neutrophils: 38 %
Phosphorus: 3.2 mg/dL (ref 2.8–4.1)
Platelets: 161 10*3/uL (ref 150–450)
Potassium: 4.1 mmol/L (ref 3.5–5.2)
RBC: 5.44 x10E6/uL (ref 4.14–5.80)
RDW: 12.5 % (ref 11.6–15.4)
Sodium: 138 mmol/L (ref 134–144)
T3 Uptake Ratio: 25 % (ref 24–39)
T4, Total: 8.5 ug/dL (ref 4.5–12.0)
TSH: 2.49 u[IU]/mL (ref 0.450–4.500)
Total Protein: 7.3 g/dL (ref 6.0–8.5)
Triglycerides: 204 mg/dL — ABNORMAL HIGH (ref 0–149)
Uric Acid: 4.1 mg/dL (ref 3.8–8.4)
VLDL Cholesterol Cal: 35 mg/dL (ref 5–40)
WBC: 7.5 10*3/uL (ref 3.4–10.8)
eGFR: 118 mL/min/{1.73_m2} (ref >59)

## 2022-03-30 ENCOUNTER — Ambulatory Visit: Payer: Self-pay | Admitting: Physician Assistant

## 2022-03-30 ENCOUNTER — Encounter: Payer: Self-pay | Admitting: Physician Assistant

## 2022-03-30 VITALS — BP 141/89 | HR 70 | Temp 98.4°F | Resp 14 | Ht 71.0 in | Wt 205.0 lb

## 2022-03-30 DIAGNOSIS — Z0000 Encounter for general adult medical examination without abnormal findings: Secondary | ICD-10-CM

## 2022-03-30 DIAGNOSIS — Z716 Tobacco abuse counseling: Secondary | ICD-10-CM

## 2022-03-30 MED ORDER — CHANTIX STARTING MONTH PAK 0.5 MG X 11 & 1 MG X 42 PO TBPK
0.5000 | ORAL_TABLET | Freq: Every day | ORAL | 0 refills | Status: DC
Start: 2022-03-30 — End: 2022-05-05

## 2022-03-30 MED ORDER — ORPHENADRINE CITRATE ER 100 MG PO TB12: 100.0000 mg | ORAL_TABLET | Freq: Two times a day (BID) | ORAL | 0 refills | Status: DC

## 2022-03-30 NOTE — Progress Notes (Signed)
[facility]  ____________________________________________   None    (approximate)  I have reviewed the triage vital signs and the nursing notes.   HISTORY  Chief Complaint No chief complaint on file.    HPI Larry Bailey is a 36 y.o. male patient presents for annual physical exam.  Patient complaint of low back pain secondary to overexertion.  Also patient wished to discuss smoking cessation.         Past Medical History:  Diagnosis Date   Anxiety    Asthma    Depression    GERD (gastroesophageal reflux disease)     Patient Active Problem List   Diagnosis Date Noted   Annual physical exam 02/09/2017   Chronic midline low back pain without sciatica 08/26/2015   Airway hyperreactivity 05/06/2015   Current tobacco use 05/06/2015   Lumbar back pain 05/06/2015   Attention deficit 05/06/2015    No past surgical history on file.  Prior to Admission medications   Medication Sig Start Date End Date Taking? Authorizing Provider  buprenorphine-naloxone (SUBOXONE) 8-2 mg SUBL SL tablet 1 tablet 2 (two) times daily. 09/27/21   [provider]  naproxen (NAPROSYN) 500 MG tablet Take 1 tablet (500 mg total) by mouth 2 (two) times daily with a meal. 03/02/22   Sable Feil, PA-C    Allergies Patient has no known allergies.  Family History  Problem Relation Age of Onset   Kidney disease Maternal Grandmother        kidney failure   Diabetes Maternal Grandfather    Cancer Maternal Grandfather     Social History Social History   Tobacco Use   Smoking status: Every Day    Types: Cigarettes   Smokeless tobacco: Never  Substance Use Topics   Alcohol use: No    Alcohol/week: 0.0 standard drinks of alcohol   Drug use: No    Review of Systems Constitutional: No fever/chills Eyes: No visual changes. ENT: No sore throat. Cardiovascular: Denies chest pain. Respiratory: Denies shortness of breath. Gastrointestinal:  No abdominal pain.  No nausea, no vomiting.  No diarrhea.  No constipation. Genitourinary: Negative for dysuria. Musculoskeletal: Positive for back pain. Skin: Negative for rash. Neurological: Negative for headaches, focal weakness or numbness. Psychiatric: Anxiety and depression  ____________________________________________   PHYSICAL EXAM:  VITAL SIGNS: BP is 141/89, pulse 70, respiration 14, temperature 98.4, patient 97% O2 sat on room air.  Patient weighs 205 pounds and BMI is 20.59. Constitutional: Alert and oriented. Well appearing and in no acute distress. Eyes: Conjunctivae are normal. PERRL. EOMI. Head: Atraumatic. Nose: No congestion/rhinnorhea. Mouth/Throat: Mucous membranes are moist.  Oropharynx non-erythematous. Neck: No stridor.  No cervical spine tenderness to palpation. Hematological/Lymphatic/Immunilogical: No cervical lymphadenopathy. Cardiovascular: Normal rate, regular rhythm. Grossly normal heart sounds.  Good peripheral circulation. Respiratory: Normal respiratory effort.  No retractions. Lungs CTAB. Gastrointestinal: Soft and nontender. No distention. No abdominal bruits. No CVA tenderness. Genitourinary: Deferred Musculoskeletal: No lower extremity tenderness nor edema.  No joint effusions. Neurologic:  Normal speech and language. No gross focal neurologic deficits are appreciated. No gait instability. Skin:  Skin is warm, dry and intact. No rash noted. Psychiatric: Mood and affect are normal. Speech and behavior are normal.  ____________________________________________   LABS  ____________________________________________  ____________________________________________     Component Ref Range & Units 7 d ago  Color, UA  Dark Amber   Clarity, UA  Clear   Glucose, UA Negative Negative   Bilirubin, UA  Negative   Ketones, UA  Negative   Spec Grav, UA 1.010 - 1.025 1.020   Blood, UA  Negative   pH, UA 5.0 - 8.0 6.5   Protein, UA Negative Positive  Abnormal    Comment: 1+ (Voided small amount)  Urobilinogen, UA 0.2 or 1.0 E.U./dL 0.2   Nitrite, UA  Negative   Leukocytes, UA Negative Negative   Appearance    Odor                             Component Ref Range & Units 7 d ago (03/23/22) 5 yr ago (02/09/17) 5 yr ago (02/09/17) 5 yr ago (02/09/17) 5 yr ago (02/09/17)  Glucose 70 - 99 mg/dL 81    95 R    Uric Acid 3.8 - 8.4 mg/dL 4.1       Comment:            Therapeutic target for gout patients: <6.0  BUN 6 - 20 mg/dL 16    10 R    Creatinine, Ser 0.76 - 1.27 mg/dL 0.81    0.87 R    eGFR >59 mL/min/1.73 118       BUN/Creatinine Ratio 9 - 20 20    11.5 R    Sodium 134 - 144 mmol/L 138    140 R    Potassium 3.5 - 5.2 mmol/L 4.1    5.1 R    Chloride 96 - 106 mmol/L 99    104 R    Calcium 8.7 - 10.2 mg/dL 10.0    9.3 R    Phosphorus 2.8 - 4.1 mg/dL 3.2       Total Protein 6.0 - 8.5 g/dL 7.3    6.3 R    Albumin 4.0 - 5.0 g/dL 5.1 High     4.3 R    Globulin, Total 1.5 - 4.5 g/dL 2.2       Albumin/Globulin Ratio 1.2 - 2.2 2.3 High        Bilirubin Total 0.0 - 1.2 mg/dL 2.1 High     0.8 R    Alkaline Phosphatase 44 - 121 IU/L 85    61 R    LDH 121 - 224 IU/L 267 High        AST 0 - 40 IU/L 35    35 R    ALT 0 - 44 IU/L 47 High     42 R    GGT 0 - 65 IU/L 103 High        Iron 38 - 169 ug/dL 228 High        Cholesterol, Total 100 - 199 mg/dL 165       Triglycerides 0 - 149 mg/dL 204 High    83 R     HDL >39 mg/dL 33 Low    36 Low  R     VLDL Cholesterol Cal 5 - 40 mg/dL 35       LDL Chol Calc (NIH) 0 - 99 mg/dL 97       Chol/HDL Ratio 0.0 - 5.0 ratio 5.0   3.3 R       Estimated CHD Risk 0.0 - 1.0 times avg. 1.0         TSH 0.450 - 4.500 uIU/mL 2.490     2.91 R   T4, Total 4.5 - 12.0 ug/dL 8.5       T3 Uptake Ratio 24 - 39 % 25  Free Thyroxine Index 1.2 - 4.9 2.1       WBC 3.4 - 10.8 x10E3/uL 7.5  5.9 R      RBC 4.14 - 5.80 x10E6/uL 5.44  5.38 R      Hemoglobin 13.0 - 17.7 g/dL 17.7  17.6 High  R      Hematocrit  37.5 - 51.0 % 50.7  51.5 High  R      MCV 79 - 97 fL 93  95.7 R      MCH 26.6 - 33.0 pg 32.5  32.7 R      MCHC 31.5 - 35.7 g/dL 34.9  34.2 R      RDW 11.6 - 15.4 % 12.5  13.2 R      Platelets 150 - 450 x10E3/uL 161  162 R      Neutrophils Not Estab. % 38  41 R      Lymphs Not Estab. % 47       Monocytes Not Estab. % 8       Eos Not Estab. % 6       Basos Not Estab. % 1       Neutrophils Absolute 1.4 - 7.0 x10E3/uL 2.9  2,419 R      Lymphocytes Absolute 0.7 - 3.1 x10E3/uL 3.5 High   2,596 R      Monocytes Absolute 0.1 - 0.9 x10E3/uL 0.6       EOS (ABSOLUTE) 0.0 - 0.4 x10E3/uL 0.5 High        Basophils Absolute 0.0 - 0.2 x10E3/uL 0.1  59 R      Immature Granulocytes Not Estab. % 0       Immature Grans              ____________________________________________   INITIAL IMPRESSION / ASSESSMENT AND PLAN As part of my medical decision making, I reviewed the following data within the electronic MEDICAL RECORD NUMBER       Discussed lab results with patient showing elevated liver enzymes.  Advised patient to decrease heavy uses of energy drinks.  Patient given a prescription for Norflex to take twice a day for 5 days.  Patient given a prescription and discharge information for Chantix.  Patient will follow-up in 3 weeks.     ____________________________________________   FINAL CLINICAL IMPRESSION Well exam   ED Discharge Orders     None        Note:  This document was prepared using Dragon voice recognition software and may include unintentional dictation errors.

## 2022-03-30 NOTE — Progress Notes (Signed)
Pt presents today to complete physical. Pt concerned of pulled muscle in back and wanting to quit smoking cigarettes.

## 2022-04-20 ENCOUNTER — Other Ambulatory Visit: Payer: Self-pay

## 2022-04-20 DIAGNOSIS — R748 Abnormal levels of other serum enzymes: Secondary | ICD-10-CM

## 2022-04-20 DIAGNOSIS — R69 Illness, unspecified: Secondary | ICD-10-CM | POA: Diagnosis not present

## 2022-04-20 NOTE — Progress Notes (Signed)
Follow up labs drawn as ordered without difficulty

## 2022-04-21 LAB — COMPREHENSIVE METABOLIC PANEL
ALT: 65 IU/L — ABNORMAL HIGH (ref 0–44)
AST: 40 IU/L (ref 0–40)
Albumin/Globulin Ratio: 2.3 — ABNORMAL HIGH (ref 1.2–2.2)
Albumin: 5 g/dL (ref 4.0–5.0)
Alkaline Phosphatase: 77 IU/L (ref 44–121)
BUN/Creatinine Ratio: 16 (ref 9–20)
BUN: 13 mg/dL (ref 6–20)
Bilirubin Total: 1.7 mg/dL — ABNORMAL HIGH (ref 0.0–1.2)
CO2: 24 mmol/L (ref 20–29)
Calcium: 9.9 mg/dL (ref 8.7–10.2)
Chloride: 102 mmol/L (ref 96–106)
Creatinine, Ser: 0.81 mg/dL (ref 0.76–1.27)
Globulin, Total: 2.2 g/dL (ref 1.5–4.5)
Glucose: 82 mg/dL (ref 70–99)
Potassium: 3.9 mmol/L (ref 3.5–5.2)
Sodium: 141 mmol/L (ref 134–144)
Total Protein: 7.2 g/dL (ref 6.0–8.5)
eGFR: 118 mL/min/{1.73_m2} (ref >59)

## 2022-05-04 ENCOUNTER — Other Ambulatory Visit: Payer: Self-pay

## 2022-05-04 DIAGNOSIS — Z720 Tobacco use: Secondary | ICD-10-CM

## 2022-05-05 ENCOUNTER — Other Ambulatory Visit: Payer: Self-pay | Admitting: Physician Assistant

## 2022-05-05 MED ORDER — VARENICLINE TARTRATE 1 MG PO TABS
1.0000 mg | ORAL_TABLET | Freq: Two times a day (BID) | ORAL | 2 refills | Status: DC
Start: 2022-05-05 — End: 2023-07-12

## 2022-05-17 DIAGNOSIS — R69 Illness, unspecified: Secondary | ICD-10-CM | POA: Diagnosis not present

## 2022-06-06 ENCOUNTER — Encounter: Payer: Self-pay | Admitting: Physician Assistant

## 2022-06-06 ENCOUNTER — Ambulatory Visit: Payer: Self-pay | Admitting: Physician Assistant

## 2022-06-06 VITALS — BP 141/93 | HR 77 | Temp 97.7°F | Resp 14 | Ht 71.0 in | Wt 210.0 lb

## 2022-06-06 DIAGNOSIS — K029 Dental caries, unspecified: Secondary | ICD-10-CM

## 2022-06-06 NOTE — Progress Notes (Signed)
   Subjective:    Patient ID: Larry Bailey, male    DOB: Nov 29, 1985, 36 y.o.   MRN: [id number]  HPI  36 yo male with long history of recurrent dental pain. Does not brush his teeth Does not have dental care. Smoked 3 packs a day for years, but now down to 1-2  In childhood was cared for by his Gramma who was adamant about good care...but when she died and he was a teenager he recognizes that he stopped all dental care.No military service  Has had abscesses in the past.  Many broken teeth. Has very few remaining teeth Last night noted the onset of discomfort left lower jawline requesting antibiotics.   Came back from the beach over the weekend- reports sinus pressure often occurs with same scenario. Uses Afrin  Review of Systems  Issues with recurrent sinusitis, seasonal allergies Tobacco abuse    Objective:   Physical Exam Overweight young male, bearded, earring, site healed Mild congestion with sniffling Clear discharge , no erythema nasal passages No facial pain , no percussive discomfort over sinuses Mouth with extensive dental fractures and missing teeth Appear grossly decayed throughout Jaw very irregular, with sections missing, unsure surgical or resorptive Heavy tarter, and gingivitis Area of question without evidence of erythema or abscess or swelling Tooth in the area has been fractured off at the gumline,dated    Assessment & Plan:   Reviewed no indication of current abscess- general condition- decay, gingivitis tarter  Extended review with patient about intervening with soft toothbrush and mouthwash swish - hold -spit. Discussed negative effect of cigarette smoking on oral tissue .  Very receptive to learning about care even at this late date. Buy back techniques and ways to limit numbers of cigarettes reviewed Has tried and discontinued Chantix in the past  Encouraged establishing with dental office- has dental insurance with the city-  Reassured that  dental anxiety is very common and many offices are now focusing on helping make a good experience. New office opened close by with Saturday hours. Given names and phone numbers for 3 offices in the area. He will try to stop in and schedule to establish. Will use Gramma memory as support to make the first step  Discourage use of Afrin when congested, trial of Fluticasone  with 1 STEN daily or BID and a focus on deep inspiration- try to put medication behind your eyebrows.  RTC prn but unable to stop the advancing process without his accessing dental specialty care

## 2022-06-06 NOTE — Progress Notes (Signed)
Pt believes to have an abscess bottom front/side of mouth/tooth. He gets them quite often. Pt requesting antibiotic before he goes to the dentist.

## 2022-06-16 DIAGNOSIS — R69 Illness, unspecified: Secondary | ICD-10-CM | POA: Diagnosis not present

## 2022-07-12 DIAGNOSIS — R69 Illness, unspecified: Secondary | ICD-10-CM | POA: Diagnosis not present

## 2022-08-04 DIAGNOSIS — R69 Illness, unspecified: Secondary | ICD-10-CM | POA: Diagnosis not present

## 2022-08-31 DIAGNOSIS — R69 Illness, unspecified: Secondary | ICD-10-CM | POA: Diagnosis not present

## 2022-10-05 DIAGNOSIS — R69 Illness, unspecified: Secondary | ICD-10-CM | POA: Diagnosis not present

## 2022-11-01 DIAGNOSIS — R69 Illness, unspecified: Secondary | ICD-10-CM | POA: Diagnosis not present

## 2022-11-29 DIAGNOSIS — R69 Illness, unspecified: Secondary | ICD-10-CM | POA: Diagnosis not present

## 2022-12-23 DIAGNOSIS — F1120 Opioid dependence, uncomplicated: Secondary | ICD-10-CM | POA: Diagnosis not present

## 2023-01-20 DIAGNOSIS — F1120 Opioid dependence, uncomplicated: Secondary | ICD-10-CM | POA: Diagnosis not present

## 2023-02-03 ENCOUNTER — Ambulatory Visit: Payer: Self-pay

## 2023-02-03 DIAGNOSIS — Z0000 Encounter for general adult medical examination without abnormal findings: Secondary | ICD-10-CM

## 2023-02-03 LAB — POCT URINALYSIS DIPSTICK
Bilirubin, UA: NEGATIVE
Blood, UA: NEGATIVE
Glucose, UA: NEGATIVE
Ketones, UA: NEGATIVE
Leukocytes, UA: NEGATIVE
Nitrite, UA: NEGATIVE
Protein, UA: NEGATIVE
Spec Grav, UA: <=1.005 — AB (ref 1.010–1.025)
Urobilinogen, UA: 0.2 E.U./dL
pH, UA: 6 (ref 5.0–8.0)

## 2023-02-03 NOTE — Progress Notes (Signed)
  Pt completed lab portion of sceduled physical.   

## 2023-02-04 LAB — CMP12+LP+TP+TSH+6AC+CBC/D/PLT
ALT: 59 IU/L — ABNORMAL HIGH (ref 0–44)
AST: 39 IU/L (ref 0–40)
Albumin/Globulin Ratio: 2.3 — ABNORMAL HIGH (ref 1.2–2.2)
Albumin: 4.9 g/dL (ref 4.1–5.1)
Alkaline Phosphatase: 88 IU/L (ref 44–121)
BUN/Creatinine Ratio: 22 — ABNORMAL HIGH (ref 9–20)
BUN: 16 mg/dL (ref 6–20)
Basophils Absolute: 0.1 10*3/uL (ref 0.0–0.2)
Basos: 1 %
Bilirubin Total: 2 mg/dL — ABNORMAL HIGH (ref 0.0–1.2)
Calcium: 9.7 mg/dL (ref 8.7–10.2)
Chloride: 98 mmol/L (ref 96–106)
Chol/HDL Ratio: 4 ratio (ref 0.0–5.0)
Cholesterol, Total: 162 mg/dL (ref 100–199)
Creatinine, Ser: 0.74 mg/dL — ABNORMAL LOW (ref 0.76–1.27)
EOS (ABSOLUTE): 0.4 10*3/uL (ref 0.0–0.4)
Eos: 4 %
Estimated CHD Risk: 0.7 times avg. (ref 0.0–1.0)
Free Thyroxine Index: 2.4 (ref 1.2–4.9)
GGT: 143 IU/L — ABNORMAL HIGH (ref 0–65)
Globulin, Total: 2.1 g/dL (ref 1.5–4.5)
Glucose: 90 mg/dL (ref 70–99)
HDL: 41 mg/dL (ref >39)
Hematocrit: 50.9 % (ref 37.5–51.0)
Hemoglobin: 16.7 g/dL (ref 13.0–17.7)
Immature Grans (Abs): 0 10*3/uL (ref 0.0–0.1)
Immature Granulocytes: 0 %
Iron: 203 ug/dL — ABNORMAL HIGH (ref 38–169)
LDH: 242 IU/L — ABNORMAL HIGH (ref 121–224)
LDL Chol Calc (NIH): 93 mg/dL (ref 0–99)
Lymphocytes Absolute: 2.9 10*3/uL (ref 0.7–3.1)
Lymphs: 31 %
MCH: 31 pg (ref 26.6–33.0)
MCHC: 32.8 g/dL (ref 31.5–35.7)
MCV: 94 fL (ref 79–97)
Monocytes Absolute: 0.8 10*3/uL (ref 0.1–0.9)
Monocytes: 8 %
Neutrophils Absolute: 5.2 10*3/uL (ref 1.4–7.0)
Neutrophils: 56 %
Phosphorus: 2.7 mg/dL — ABNORMAL LOW (ref 2.8–4.1)
Platelets: 187 10*3/uL (ref 150–450)
Potassium: 4 mmol/L (ref 3.5–5.2)
RBC: 5.39 x10E6/uL (ref 4.14–5.80)
RDW: 12.5 % (ref 11.6–15.4)
Sodium: 138 mmol/L (ref 134–144)
T3 Uptake Ratio: 28 % (ref 24–39)
T4, Total: 8.5 ug/dL (ref 4.5–12.0)
TSH: 2.63 u[IU]/mL (ref 0.450–4.500)
Total Protein: 7 g/dL (ref 6.0–8.5)
Triglycerides: 158 mg/dL — ABNORMAL HIGH (ref 0–149)
Uric Acid: 4.5 mg/dL (ref 3.8–8.4)
VLDL Cholesterol Cal: 28 mg/dL (ref 5–40)
WBC: 9.3 10*3/uL (ref 3.4–10.8)
eGFR: 120 mL/min/{1.73_m2} (ref >59)

## 2023-02-08 ENCOUNTER — Encounter: Payer: Self-pay | Admitting: Physician Assistant

## 2023-02-08 ENCOUNTER — Ambulatory Visit: Payer: Self-pay | Admitting: Physician Assistant

## 2023-02-08 VITALS — BP 129/90 | HR 72 | Temp 99.5°F | Ht 71.0 in | Wt 190.0 lb

## 2023-02-08 DIAGNOSIS — Z0000 Encounter for general adult medical examination without abnormal findings: Secondary | ICD-10-CM

## 2023-02-08 NOTE — Progress Notes (Signed)
[facility] [employer] [facility] occupational health clinic ____________________________________________   None    (approximate)  I have reviewed the triage vital signs and the nursing notes.   HISTORY  Chief Complaint No chief complaint on file.    HPI Larry Bailey is a 37 y.o. male patient presents for annual physical exam.  Patient with no concerning complaints.         Past Medical History:  Diagnosis Date   Anxiety    Asthma    Depression    GERD (gastroesophageal reflux disease)     Patient Active Problem List   Diagnosis Date Noted   Annual physical exam 02/09/2017   Chronic midline low back pain without sciatica 08/26/2015   Airway hyperreactivity 05/06/2015   Current tobacco use 05/06/2015   Lumbar back pain 05/06/2015   Attention deficit 05/06/2015    No past surgical history on file.  Prior to Admission medications   Medication Sig Start Date End Date Taking? Authorizing Provider  buprenorphine-naloxone (SUBOXONE) 8-2 mg SUBL SL tablet 1 tablet 2 (two) times daily. 09/27/21   [provider]  varenicline (CHANTIX) 1 MG tablet Take 1 tablet (1 mg total) by mouth 2 (two) times daily. 05/05/22   Joni Reining, PA-C    Allergies Patient has no known allergies.  Family History  Problem Relation Age of Onset   Kidney disease Maternal Grandmother        kidney failure   Diabetes Maternal Grandfather    Cancer Maternal Grandfather     Social History Social History   Tobacco Use   Smoking status: Every Day    Types: Cigarettes   Smokeless tobacco: Never  Substance Use Topics   Alcohol use: No    Alcohol/week: 0.0 standard drinks of alcohol   Drug use: No    Review of Systems Constitutional: No fever/chills Eyes: No visual changes. ENT: No sore throat. Cardiovascular: Denies chest pain. Respiratory: Denies shortness of breath. Gastrointestinal: No abdominal pain.  No nausea, no vomiting.  No diarrhea.  No constipation. Genitourinary:  Negative for dysuria. Musculoskeletal: Negative for back pain. Skin: Negative for rash. Neurological: Negative for headaches, focal weakness or numbness. Psychiatric: Anxiety and depression ____________________________________________   PHYSICAL EXAM:  VITAL SIGNS: Constitutional: Alert and oriented. Well appearing and in no acute distress. Eyes: Conjunctivae are normal. PERRL. EOMI. Head: Atraumatic. Nose: No congestion/rhinnorhea. Mouth/Throat: Mucous membranes are moist.  Oropharynx non-erythematous. Neck: No stridor.  No cervical spine tenderness to palpation. Hematological/Lymphatic/Immunilogical: No cervical lymphadenopathy. Cardiovascular: Normal rate, regular rhythm. Grossly normal heart sounds.  Good peripheral circulation. Respiratory: Normal respiratory effort.  No retractions. Lungs CTAB. Gastrointestinal: Soft and nontender. No distention. No abdominal bruits. No CVA tenderness. Genitourinary: Deferred Musculoskeletal: No lower extremity tenderness nor edema.  No joint effusions. Neurologic:  Normal speech and language. No gross focal neurologic deficits are appreciated. No gait instability. Skin:  Skin is warm, dry and intact. No rash noted. Psychiatric: Mood and affect are normal. Speech and behavior are normal.  ____________________________________________   LABS      Component Ref Range & Units 5 d ago 10 mo ago  Color, UA Yellow Dark Public affairs consultant, UA Clear Clear  Glucose, UA Negative Negative Negative  Bilirubin, UA Negative Negative  Ketones, UA Negative Negative  Spec Grav, UA 1.010 - 1.025 <=1.005 Abnormal  1.020  Blood, UA Negative Negative  pH, UA 5.0 - 8.0 6.0 6.5  Protein, UA Negative Negative Positive Abnormal  CM  Urobilinogen, UA 0.2 or 1.0  E.U./dL 0.2 0.2  Nitrite, UA Negative Negative  Leukocytes, UA Negative Negative Negative  Appearance    Odor                  Component Ref Range & Units 5 d ago (02/03/23) 9 mo  ago (04/20/22) 10 mo ago (03/23/22) 6 yr ago (02/09/17) 6 yr ago (02/09/17) 6 yr ago (02/09/17) 6 yr ago (02/09/17)  Glucose 70 - 99 mg/dL 90 82 81   95 R   Uric Acid 3.8 - 8.4 mg/dL 4.5  4.1 CM      Comment:            Therapeutic target for gout patients: <6.0  BUN 6 - 20 mg/dL R   Creatinine, Ser 0.76 - 1.27 mg/dL 1.61 Low  0.96 0.45   4.09 R   eGFR >59 mL/min/1.73 120 118 118      BUN/Creatinine Ratio 9 - 20 22 High  16 20   11.5 R   Sodium 134 - 144 mmol/L 138 141 138   140 R   Potassium 3.5 - 5.2 mmol/L 4.0 3.9 4.1   5.1 R   Chloride 96 - 106 mmol/L 98 102 99   104 R   Calcium 8.7 - 10.2 mg/dL 9.7 9.9 81.1   9.3 R   Phosphorus 2.8 - 4.1 mg/dL 2.7 Low   3.2      Total Protein 6.0 - 8.5 g/dL 7.0 7.2 7.3   6.3 R   Albumin 4.1 - 5.1 g/dL 4.9 5.0 R, CM 5.1 High  R   4.3 R   Globulin, Total 1.5 - 4.5 g/dL 2.1 2.2 2.2      Albumin/Globulin Ratio 1.2 - 2.2 2.3 High  2.3 High  2.3 High       Bilirubin Total 0.0 - 1.2 mg/dL 2.0 High  1.7 High  2.1 High    0.8 R   Alkaline Phosphatase 44 - 121 IU/L 88 77 85   61 R   LDH 121 - 224 IU/L 242 High   267 High       AST 0 - 40 IU/L 39 40 35   35 R   ALT 0 - 44 IU/L 59 High  65 High  47 High    42 R   GGT 0 - 65 IU/L 143 High   103 High       Iron 38 - 169 ug/dL 914 High   782 High       Cholesterol, Total 100 - 199 mg/dL 956  213      Triglycerides 0 - 149 mg/dL 086 High   578 High   83 R    HDL >39 mg/dL 41  33 Low   36 Low  R    VLDL Cholesterol Cal 5 - 40 mg/dL 28  35      LDL Chol Calc (NIH) 0 - 99 mg/dL 93  97      Chol/HDL Ratio 0.0 - 5.0 ratio 4.0  5.0 CM  3.3 R    Comment:                                   T. Chol/HDL Ratio  Men  Women                               1/2 Avg.Risk  3.4    3.3                                   Avg.Risk  5.0    4.4                                2X Avg.Risk  9.6    7.1                                3X Avg.Risk 23.4   11.0   Estimated CHD Risk 0.0 - 1.0 times avg. 0.7  1.0 CM      Comment: The CHD Risk is based on the T. Chol/HDL ratio. Other factors affect CHD Risk such as hypertension, smoking, diabetes, severe obesity, and family history of premature CHD.  TSH 0.450 - 4.500 uIU/mL 2.630  2.490    2.91 R  T4, Total 4.5 - 12.0 ug/dL 8.5  8.5      T3 Uptake Ratio 24 - 39 % 28  25      Free Thyroxine Index 1.2 - 4.9 2.4  2.1      WBC 3.4 - 10.8 x10E3/uL 9.3  7.5 5.9 R     RBC 4.14 - 5.80 x10E6/uL 5.39  5.44 5.38 R     Hemoglobin 13.0 - 17.7 g/dL 81.1  91.4 78.2 High  R     Hematocrit 37.5 - 51.0 % 50.9  50.7 51.5 High  R     MCV 79 - 97 fL 94  93 95.7 R     MCH 26.6 - 33.0 pg 31.0  32.5 32.7 R     MCHC 31.5 - 35.7 g/dL 95.6  21.3 08.6 R     RDW 11.6 - 15.4 % 12.5  12.5 13.2 R     Platelets 150 - 450 x10E3/uL 187  161 162 R     Neutrophils Not Estab. % 56  38 41 R     Lymphs Not Estab. % 31  47      Monocytes Not Estab. % 8  8      Eos Not Estab. % 4  6      Basos Not Estab. % 1  1      Neutrophils Absolute 1.4 - 7.0 x10E3/uL 5.2  2.9 2,419 R     Lymphocytes Absolute 0.7 - 3.1 x10E3/uL 2.9  3.5 High  2,596 R     Monocytes Absolute 0.1 - 0.9 x10E3/uL 0.8  0.6      EOS (ABSOLUTE) 0.0 - 0.4 x10E3/uL 0.4  0.5 High       Basophils Absolute 0.0 - 0.2 x10E3/uL 0.1  0.1 59 R     Immature Granulocytes Not Estab. % 0  0      Immature Grans (Abs)              ____________________________________________    ____________________________________________   INITIAL IMPRESSION / ASSESSMENT AND PLAN  As part of my medical decision making, I reviewed the following data within the electronic MEDICAL RECORD NUMBER      Discussed no  acute findings of physical exam and labs.        ____________________________________________   FINAL CLINICAL IMPRESSION( All exam   ED Discharge Orders     None        Note:  This document was prepared using Dragon voice recognition software  and may include unintentional dictation errors.

## 2023-02-20 DIAGNOSIS — F1120 Opioid dependence, uncomplicated: Secondary | ICD-10-CM | POA: Diagnosis not present

## 2023-03-22 DIAGNOSIS — F1120 Opioid dependence, uncomplicated: Secondary | ICD-10-CM | POA: Diagnosis not present

## 2023-04-21 DIAGNOSIS — F1120 Opioid dependence, uncomplicated: Secondary | ICD-10-CM | POA: Diagnosis not present

## 2023-05-21 DIAGNOSIS — R0781 Pleurodynia: Secondary | ICD-10-CM | POA: Diagnosis not present

## 2023-05-21 DIAGNOSIS — J209 Acute bronchitis, unspecified: Secondary | ICD-10-CM | POA: Diagnosis not present

## 2023-05-21 DIAGNOSIS — R062 Wheezing: Secondary | ICD-10-CM | POA: Diagnosis not present

## 2023-05-22 DIAGNOSIS — F1120 Opioid dependence, uncomplicated: Secondary | ICD-10-CM | POA: Diagnosis not present

## 2023-05-25 ENCOUNTER — Ambulatory Visit (INDEPENDENT_AMBULATORY_CARE_PROVIDER_SITE_OTHER): Payer: 59 | Admitting: Physician Assistant

## 2023-05-25 ENCOUNTER — Encounter: Payer: Self-pay | Admitting: Physician Assistant

## 2023-05-25 VITALS — BP 120/70 | HR 61 | Ht 71.0 in | Wt 205.4 lb

## 2023-05-25 DIAGNOSIS — Z833 Family history of diabetes mellitus: Secondary | ICD-10-CM

## 2023-05-25 DIAGNOSIS — R0989 Other specified symptoms and signs involving the circulatory and respiratory systems: Secondary | ICD-10-CM

## 2023-05-25 DIAGNOSIS — R748 Abnormal levels of other serum enzymes: Secondary | ICD-10-CM | POA: Diagnosis not present

## 2023-05-25 DIAGNOSIS — F17200 Nicotine dependence, unspecified, uncomplicated: Secondary | ICD-10-CM | POA: Diagnosis not present

## 2023-05-25 DIAGNOSIS — Z7689 Persons encountering health services in other specified circumstances: Secondary | ICD-10-CM

## 2023-05-25 DIAGNOSIS — E663 Overweight: Secondary | ICD-10-CM

## 2023-05-25 DIAGNOSIS — Z8249 Family history of ischemic heart disease and other diseases of the circulatory system: Secondary | ICD-10-CM

## 2023-05-25 DIAGNOSIS — R030 Elevated blood-pressure reading, without diagnosis of hypertension: Secondary | ICD-10-CM | POA: Diagnosis not present

## 2023-05-25 NOTE — Progress Notes (Signed)
New patient visit  Patient: Larry Bailey   DOB: 26-Mar-1986   37 y.o. Male  MRN: [id number] Visit Date: 05/25/2023  Today's healthcare provider: Debera Lat, PA-C   Chief Complaint  Patient presents with   Establish Care   Subjective    Larry Bailey is a 37 y.o. male who presents today as a new patient to establish care.   Discussed the use of AI scribe software for clinical note transcription with the patient, who gave verbal consent to proceed.  History of Present Illness   The patient, with a history of smoking and a family history of diabetes, presents for a primary care visit. He was previously seen by a city doctor and has decided to establish care at this clinic as his wife is also a patient here. He reports no current health concerns, but his wife has expressed worry about his recent physical exam results, which showed some high numbers. The patient is unsure of the specifics of these results, but believes they were done in April.  The patient admits to drinking a lot of energy drinks and consuming alcohol about once a week. He also reports a daily smoking habit of about a pack and a half. He has noticed a slight weight gain, but his weight generally stays around 200 pounds, which he considers normal for him. He has not noticed any chest pain, shortness of breath, or rapid heart beating. He also reports no problems with bowel movements or urination.  The patient has been getting over bronchitis in the past couple of weeks and has been experiencing a lot of mucus production. He also had a coughing spell last week that caused some rib pain, but he did not seek medical attention for it. He has been taking over-the-counter medications such as Allegra or Claritin for his symptoms.        Past Medical History:  Diagnosis Date   Anxiety    Asthma    Depression    GERD (gastroesophageal reflux disease)    No past surgical history on file. Family Status  Relation Name  Status   Mother  Alive   Father  Alive   Brother  Alive   MGM  Deceased   MGF  Alive   PGM  Alive   PGF  Alive  No partnership data on file   Family History  Problem Relation Age of Onset   Kidney disease Maternal Grandmother        kidney failure   Diabetes Maternal Grandfather    Cancer Maternal Grandfather    Social History   Socioeconomic History   Marital status: Married    Spouse name: Not on file   Number of children: 0   Years of education: Not on file   Highest education level: Not on file  Occupational History   Occupation: L[employer]    Comment: [employer]  Tobacco Use   Smoking status: Every Day    Types: Cigarettes   Smokeless tobacco: Never  Substance and Sexual Activity   Alcohol use: No    Alcohol/week: 0.0 standard drinks of alcohol   Drug use: No   Sexual activity: Yes    Partners: Female    Birth control/protection: None  Other Topics Concern   Not on file  Social History Narrative   Not on file   Social Determinants of Health   Financial Resource Strain: Not on file  Food Insecurity: Not on file  Transportation Needs: Not on file  Physical Activity: Not on file  Stress: Not on file  Social Connections: Not on file   Outpatient Medications Prior to Visit  Medication Sig   buprenorphine-naloxone (SUBOXONE) 8-2 mg SUBL SL tablet 1 tablet 2 (two) times daily.   varenicline (CHANTIX) 1 MG tablet Take 1 tablet (1 mg total) by mouth 2 (two) times daily.   No facility-administered medications prior to visit.   Allergies  Allergen Reactions   Cephalexin Hives     There is no immunization history on file for this patient.  Health Maintenance  Topic Date Due   COVID-19 Vaccine (1 - 2023-24 season) Never done   INFLUENZA VACCINE  05/18/2023   Hepatitis C Screening  02/08/2024 (Originally 08/27/2004)   HIV Screening  02/08/2024 (Originally 08/27/2001)   HPV VACCINES  Aged Out   DTaP/Tdap/Td  Discontinued    Patient Care  Team: Debera Lat, PA-C as PCP - General (Physician Assistant)  Review of Systems  All other systems reviewed and are negative.  Except see HPI       Objective    BP 120/70 (BP Location: Left Arm, Patient Position: Sitting, Cuff Size: Normal)   Pulse 61   Ht 5\' 11"  (1.803 m)   Wt 205 lb 6.4 oz (93.2 kg)   SpO2 96%   BMI 28.65 kg/m     Physical Exam Vitals reviewed.  Constitutional:      General: He is not in acute distress.    Appearance: Normal appearance. He is not diaphoretic.  HENT:     Head: Normocephalic and atraumatic.  Eyes:     General: No scleral icterus.    Conjunctiva/sclera: Conjunctivae normal.  Cardiovascular:     Rate and Rhythm: Normal rate and regular rhythm.     Pulses: Normal pulses.     Heart sounds: Normal heart sounds. No murmur heard. Pulmonary:     Effort: Pulmonary effort is normal. No respiratory distress.     Breath sounds: Rales (right lower lung filed) present.  Musculoskeletal:     Cervical back: Neck supple.     Right lower leg: No edema.     Left lower leg: No edema.  Lymphadenopathy:     Cervical: No cervical adenopathy.  Skin:    General: Skin is warm and dry.     Findings: No rash.  Neurological:     Mental Status: He is alert and oriented to person, place, and time. Mental status is at baseline.  Psychiatric:        Mood and Affect: Mood normal.        Behavior: Behavior normal.     Depression Screen    08/26/2015   10:05 AM 05/06/2015    3:21 PM  PHQ 2/9 Scores  PHQ - 2 Score 0 2  PHQ- 9 Score  14   No results found for any visits on 05/25/23.  Assessment & Plan        1. Overweight (BMI 25.0-29.9) Chronic. BMI 29+ Advised dietary changes and daily exercise/steps at work - Lipid panel - Hemoglobin A1c - Comprehensive metabolic panel - CBC with Differential/Platelet - TSH - DG Chest 2 View; Future Will reassess after  receiving lab results  2. Elevated blood pressure reading Elevated blood pressure  noted during visit. No current treatment. Discussed potential lifestyle factors including diet, smoking, and lack of exercise. Family history of cardiovascular disease noted. -Plan to monitor blood pressure at home, morning and evening, for two weeks. Document any potential triggers for elevated  readings. -Consider starting antihypertensive medication at follow-up visit in two weeks. - Lipid panel - Hemoglobin A1c - Comprehensive metabolic panel - CBC with Differential/Platelet - TSH - DG Chest 2 View; Future Will reassess in 2 weeks  Family history of diabetes mellitus Positive family history of diabetes. No current symptoms reported. -Order fasting blood glucose and HbA1c to screen for diabetes. Family history of cardiovascular disease - Lipid panel - Hemoglobin A1c - Comprehensive metabolic panel - CBC with Differential/Platelet - TSH - DG Chest 2 View; Future  Smoking Current smoker, approximately 1.5 packs per day. -Advise smoking cessation. Discuss potential aids for quitting, such as medication or nicotine replacement therapy.  Abnormal Liver Enzymes Elevated liver enzymes noted on previous blood work. Patient reports high consumption of energy drinks and moderate alcohol intake. -Repeat liver function tests. -Advise reduction in energy drink and alcohol consumption.  Respiratory Symptoms Recent history of bronchitis with ongoing cough and abnormal lung sounds on examination. -Order chest X-ray to evaluate for any underlying lung pathology. -Advise over-the-counter Mucinex and antihistamines for symptom relief.  Encounter to establish care Welcomed to our clinic Reviewed past medical hx, social hx, family hx and surgical hx Pt advised to send all vaccination records or screening   Return in about 2 weeks (around 06/08/2023) for BP f/u.   Follow-up Schedule follow-up appointment in two weeks to review lab results, monitor blood pressure, and discuss potential need for  medication initiation.  The patient was advised to call back or seek an in-person evaluation if the symptoms worsen or if the condition fails to improve as anticipated.  I discussed the assessment and treatment plan with the patient. The patient was provided an opportunity to ask questions and all were answered. The patient agreed with the plan and demonstrated an understanding of the instructions.  I, Debera Lat, PA-C have reviewed all documentation for this visit. The documentation on  05/25/23 for the exam, diagnosis, procedures, and orders are all accurate and complete.  Debera Lat, [facility], MMS [facility] [phone] (phone) [phone] (fax)  [facility]

## 2023-06-09 ENCOUNTER — Ambulatory Visit: Payer: 59 | Admitting: Physician Assistant

## 2023-06-15 ENCOUNTER — Encounter: Payer: Self-pay | Admitting: Physician Assistant

## 2023-06-15 ENCOUNTER — Ambulatory Visit (INDEPENDENT_AMBULATORY_CARE_PROVIDER_SITE_OTHER): Payer: 59 | Admitting: Physician Assistant

## 2023-06-15 VITALS — BP 122/83 | HR 71 | Ht 71.0 in | Wt 197.0 lb

## 2023-06-15 DIAGNOSIS — F17200 Nicotine dependence, unspecified, uncomplicated: Secondary | ICD-10-CM | POA: Diagnosis not present

## 2023-06-15 DIAGNOSIS — Z833 Family history of diabetes mellitus: Secondary | ICD-10-CM | POA: Diagnosis not present

## 2023-06-15 DIAGNOSIS — R748 Abnormal levels of other serum enzymes: Secondary | ICD-10-CM | POA: Diagnosis not present

## 2023-06-15 DIAGNOSIS — R030 Elevated blood-pressure reading, without diagnosis of hypertension: Secondary | ICD-10-CM | POA: Insufficient documentation

## 2023-06-15 DIAGNOSIS — Z8249 Family history of ischemic heart disease and other diseases of the circulatory system: Secondary | ICD-10-CM | POA: Diagnosis not present

## 2023-06-15 DIAGNOSIS — E663 Overweight: Secondary | ICD-10-CM | POA: Diagnosis not present

## 2023-06-15 NOTE — Progress Notes (Signed)
Established patient visit  Patient: Larry Bailey   DOB: 03-12-1986   37 y.o. Male  MRN: [id number] Visit Date: 06/15/2023  Today's healthcare provider: Debera Lat, PA-C   Chief Complaint  Patient presents with   Medical Management of Chronic Issues    Follow up for BP and blood work, no concerns today    Subjective     Discussed the use of AI scribe software for clinical note transcription with the patient, who gave verbal consent to proceed.  History of Present Illness   The patient, with a history of high blood pressure and elevated liver enzymes, presents with concerns about high blood pressure readings at home. Despite normal readings in the office, the patient reports blood pressure readings in the 150s over 90s at home, both in the morning and evening. The patient's lifestyle includes drinking energy drinks throughout the day, smoking about a pack and a half of cigarettes daily, and consuming salty foods. The patient's wife notes that the patient's blood pressure may be stimulated by the energy drinks and smoking.  In addition to the high blood pressure, the patient's liver enzymes are elevated. The patient reports having dark urine frequently but denies any other symptoms such as nausea, vomiting, abdominal pain, or yellowing of the eyes. The patient has not yet completed the recommended lab work.           08/26/2015   10:05 AM 05/06/2015    3:21 PM  Depression screen PHQ 2/9  Decreased Interest 0 1  Down, Depressed, Hopeless 0 1  PHQ - 2 Score 0 2  Altered sleeping  3  Tired, decreased energy  3  Change in appetite  0  Feeling bad or failure about yourself   1  Trouble concentrating  3  Moving slowly or fidgety/restless  2  Suicidal thoughts  0  PHQ-9 Score  14  Difficult doing work/chores  Somewhat difficult       No data to display          Medications: Outpatient Medications Prior to Visit  Medication Sig   buprenorphine-naloxone (SUBOXONE) 8-2 mg  SUBL SL tablet 1 tablet 2 (two) times daily.   varenicline (CHANTIX) 1 MG tablet Take 1 tablet (1 mg total) by mouth 2 (two) times daily.   No facility-administered medications prior to visit.    Review of Systems  All other systems reviewed and are negative.  Except see HPI       Objective    BP 122/83 (BP Location: Right Arm, Patient Position: Sitting, Cuff Size: Large)   Pulse 71   Ht 5\' 11"  (1.803 m)   Wt 197 lb (89.4 kg)   SpO2 98%   BMI 27.48 kg/m     Physical Exam Vitals reviewed.  Constitutional:      General: He is not in acute distress.    Appearance: Normal appearance. He is not diaphoretic.  HENT:     Head: Normocephalic and atraumatic.  Eyes:     General: No scleral icterus.    Conjunctiva/sclera: Conjunctivae normal.  Cardiovascular:     Rate and Rhythm: Normal rate and regular rhythm.     Pulses: Normal pulses.     Heart sounds: Normal heart sounds. No murmur heard. Pulmonary:     Effort: Pulmonary effort is normal. No respiratory distress.     Breath sounds: Normal breath sounds. No wheezing or rhonchi.  Musculoskeletal:     Cervical back: Neck supple.     Right  lower leg: No edema.     Left lower leg: No edema.  Lymphadenopathy:     Cervical: No cervical adenopathy.  Skin:    General: Skin is warm and dry.     Findings: No rash.  Neurological:     Mental Status: He is alert and oriented to person, place, and time. Mental status is at baseline.  Psychiatric:        Mood and Affect: Mood normal.        Behavior: Behavior normal.      No results found for any visits on 06/15/23.  Assessment & Plan        Elevated BP  Elevated home blood pressure readings in the 150s/90s, despite normal readings in the office. Contributing factors include consumption of energy drinks and smoking. -Advise to cease energy drink consumption and smoking. -Encourage dietary modifications, including reduced salt intake. -Advise to monitor blood pressure at  home and return in two weeks if readings remain high. Advised to bring his BP devise with him to the next appt  Elevated Liver Enzymes Previous labs showed elevated liver enzymes. No current symptoms of liver disease. -Order repeat labs to assess liver function. Will reassess after  receiving lab results  Overweight Advised: Lifestyle Modification Current lifestyle habits including smoking, high salt intake, and energy drink consumption are contributing to health concerns. -Advise to cease smoking and energy drink consumption. -Encourage dietary modifications, including reduced salt intake. -Encourage regular exercise and weight loss.  Follow-up Schedule follow-up appointment in three months, or sooner if home blood pressure readings remain high or if there are concerns about lab results.      Return in about 3 months (around 09/15/2023) for chronic disease f/u, BP f/u.     The patient was advised to call back or seek an in-person evaluation if the symptoms worsen or if the condition fails to improve as anticipated.  I discussed the assessment and treatment plan with the patient. The patient was provided an opportunity to ask questions and all were answered. The patient agreed with the plan and demonstrated an understanding of the instructions.  I, Debera Lat, PA-C have reviewed all documentation for this visit. The documentation on 06/15/23  for the exam, diagnosis, procedures, and orders are all accurate and complete.  I spent 20 Minutes caring for his patient today face to face, reviewing labs, performing a medically appropriate examination and /or evaluation, counseling and educating the patient on elevated BP and liver enzymes, appropriate lifestyle modifications and smoking cessation, documenting in the record   Debera Lat, [facility], MMS [facility] [phone] (phone) [phone] (fax)  [facility]

## 2023-06-16 LAB — COMPREHENSIVE METABOLIC PANEL
Alkaline Phosphatase: 90 IU/L (ref 44–121)
BUN: 16 mg/dL (ref 6–20)

## 2023-06-16 LAB — CBC WITH DIFFERENTIAL/PLATELET
MCHC: 33.7 g/dL (ref 31.5–35.7)
Monocytes Absolute: 0.5 10*3/uL (ref 0.1–0.9)
Neutrophils Absolute: 2.7 10*3/uL (ref 1.4–7.0)
WBC: 5.8 10*3/uL (ref 3.4–10.8)

## 2023-06-16 LAB — LIPID PANEL: Triglycerides: 123 mg/dL (ref 0–149)

## 2023-06-21 DIAGNOSIS — F1120 Opioid dependence, uncomplicated: Secondary | ICD-10-CM | POA: Diagnosis not present

## 2023-07-12 ENCOUNTER — Encounter: Payer: Self-pay | Admitting: Physician Assistant

## 2023-07-12 ENCOUNTER — Ambulatory Visit: Payer: Self-pay | Admitting: Physician Assistant

## 2023-07-12 VITALS — BP 143/94 | Temp 100.1°F | Resp 12

## 2023-07-12 DIAGNOSIS — L03115 Cellulitis of right lower limb: Secondary | ICD-10-CM

## 2023-07-12 MED ORDER — HYDROXYZINE PAMOATE 25 MG PO CAPS: 25.0000 mg | ORAL_CAPSULE | Freq: Three times a day (TID) | ORAL | 0 refills | Status: DC | PRN

## 2023-07-12 MED ORDER — DOXYCYCLINE MONOHYDRATE 100 MG PO CAPS: 100.0000 mg | ORAL_CAPSULE | Freq: Two times a day (BID) | ORAL | 0 refills | Status: DC

## 2023-07-12 MED ORDER — IBUPROFEN 800 MG PO TABS: 800.0000 mg | ORAL_TABLET | Freq: Three times a day (TID) | ORAL | 0 refills | Status: DC | PRN

## 2023-07-12 NOTE — Progress Notes (Signed)
Subjective: Right inguinal lump and rash right lower leg    Patient ID: Larry Bailey, male    DOB: 07-27-1986, 37 y.o.   MRN: [id number]  HPI Patient complain of right groin "lump on "and itchy rash right lower leg x 3 days.  Patient also complaining of intermittent fever and nausea but denies vomiting.  Patient states he was bush hogging 3 days ago.   Review of Systems Negative except for above complaint    Objective:   Physical Exam BP 143/94  BP Location Left Arm  Patient Position Sitting  Cuff Size Normal  Resp 12  Temp 100.1 F (37.8 C)  SpO2 96 %  No acute distress.  Patient has a right inguinal with diffuse erythema to the right lower leg.  Neurovascular intact.  Free and equal range of motion.      Assessment & Plan: Cellulitis   Patient given a prescription for doxycycline, Atarax, and ibuprofen.  Patient return back in 5 days for reevaluation.

## 2023-07-12 NOTE — Progress Notes (Signed)
S/Sx started 2 night ago: 1st noticed a lump in right groing Yesterday AM noticed rash on right lower leg - lower leg swollen, read to touch & raised splotchy.  States rash itches & hurts  Has been taking ibuprofen  States he was bush hogging on Sunday  C/O nauseated since yesterday - states got better as day went on but woke up feeling nauseated again. No vomiting  States he's worked both Monday & Tuesday of this week.  AMD

## 2023-07-14 ENCOUNTER — Ambulatory Visit (INDEPENDENT_AMBULATORY_CARE_PROVIDER_SITE_OTHER): Payer: 59 | Admitting: Family Medicine

## 2023-07-14 ENCOUNTER — Encounter: Payer: Self-pay | Admitting: Family Medicine

## 2023-07-14 VITALS — BP 125/86 | HR 101 | Temp 98.4°F | Ht 71.0 in | Wt 194.6 lb

## 2023-07-14 DIAGNOSIS — Z720 Tobacco use: Secondary | ICD-10-CM | POA: Diagnosis not present

## 2023-07-14 DIAGNOSIS — Z881 Allergy status to other antibiotic agents status: Secondary | ICD-10-CM | POA: Diagnosis not present

## 2023-07-14 DIAGNOSIS — G8929 Other chronic pain: Secondary | ICD-10-CM | POA: Diagnosis not present

## 2023-07-14 DIAGNOSIS — L02415 Cutaneous abscess of right lower limb: Secondary | ICD-10-CM | POA: Diagnosis not present

## 2023-07-14 DIAGNOSIS — Z0389 Encounter for observation for other suspected diseases and conditions ruled out: Secondary | ICD-10-CM | POA: Diagnosis not present

## 2023-07-14 DIAGNOSIS — M7989 Other specified soft tissue disorders: Secondary | ICD-10-CM

## 2023-07-14 DIAGNOSIS — K7689 Other specified diseases of liver: Secondary | ICD-10-CM | POA: Diagnosis not present

## 2023-07-14 DIAGNOSIS — L03115 Cellulitis of right lower limb: Secondary | ICD-10-CM | POA: Diagnosis not present

## 2023-07-14 DIAGNOSIS — R7989 Other specified abnormal findings of blood chemistry: Secondary | ICD-10-CM | POA: Diagnosis not present

## 2023-07-14 DIAGNOSIS — J45909 Unspecified asthma, uncomplicated: Secondary | ICD-10-CM | POA: Diagnosis not present

## 2023-07-14 DIAGNOSIS — R591 Generalized enlarged lymph nodes: Secondary | ICD-10-CM | POA: Diagnosis not present

## 2023-07-14 DIAGNOSIS — Z79899 Other long term (current) drug therapy: Secondary | ICD-10-CM | POA: Diagnosis not present

## 2023-07-14 DIAGNOSIS — F17210 Nicotine dependence, cigarettes, uncomplicated: Secondary | ICD-10-CM | POA: Diagnosis not present

## 2023-07-14 DIAGNOSIS — R945 Abnormal results of liver function studies: Secondary | ICD-10-CM | POA: Diagnosis not present

## 2023-07-14 NOTE — Patient Instructions (Signed)
**   SEEK EMERGENT CARE NOW**  -concern for possible infection to R leg vs blood clot with acute/worsening swelling and pain and failed trial of oral doxycycline  Borderline HR elevation and temperature

## 2023-07-14 NOTE — Progress Notes (Signed)
Established patient visit   Patient: Larry Bailey   DOB: 1985/11/23   37 y.o. Male  MRN: [id number] Visit Date: 07/14/2023  Today's healthcare provider: Jacky Kindle, FNP  Introduced to nurse practitioner role and practice setting.  All questions answered.  Discussed provider/patient relationship and expectations.  Subjective    HPI  Failed oral ABX with worsening pain, swelling and redness to R leg. Reports "knot" in R thigh is improved but distal leg is acutely worse.  Denies known tick/animal bite. Works with heavy equipment, unable to put weight on R leg evenly due to pain  Hx of pain med use (oral) denies hx of IVDU  Obvious dental caries  Medications: Outpatient Medications Prior to Visit  Medication Sig   buprenorphine-naloxone (SUBOXONE) 8-2 mg SUBL SL tablet 1 tablet 2 (two) times daily.   doxycycline (MONODOX) 100 MG capsule Take 1 capsule (100 mg total) by mouth 2 (two) times daily.   hydrOXYzine (VISTARIL) 25 MG capsule Take 1 capsule (25 mg total) by mouth every 8 (eight) hours as needed.   ibuprofen (ADVIL) 800 MG tablet Take 1 tablet (800 mg total) by mouth every 8 (eight) hours as needed for moderate pain.   No facility-administered medications prior to visit.    Review of Systems      Objective    BP 125/86 (BP Location: Left Arm, Patient Position: Sitting, Cuff Size: Normal)   Pulse (!) 101   Temp 98.4 F (36.9 C) (Oral)   Ht 5\' 11"  (1.803 m)   Wt 194 lb 9.6 oz (88.3 kg)   SpO2 99%   BMI 27.14 kg/m     Physical Exam Vitals and nursing note reviewed.  Constitutional:      Appearance: Normal appearance. He is overweight. He is ill-appearing.  HENT:     Head: Normocephalic and atraumatic.  Cardiovascular:     Rate and Rhythm: Regular rhythm. Tachycardia present.     Pulses: Normal pulses.     Heart sounds: Normal heart sounds.  Pulmonary:     Effort: Pulmonary effort is normal.     Breath sounds: Normal breath sounds.   Musculoskeletal:        General: Tenderness present. Normal range of motion.     Cervical back: Normal range of motion.     Right lower leg: 3+ Pitting Edema present.  Skin:    General: Skin is warm and dry.     Capillary Refill: Capillary refill takes less than 2 seconds.     Coloration: Skin is pale.       Neurological:     General: No focal deficit present.     Mental Status: He is alert and oriented to person, place, and time. Mental status is at baseline.     Gait: Gait abnormal.  Psychiatric:        Mood and Affect: Mood normal.        Behavior: Behavior normal.        Thought Content: Thought content normal.        Judgment: Judgment normal.     No results found for any visits on 07/14/23.  Assessment & Plan     Problem List Items Addressed This Visit       Other   Right leg swelling - Primary    Acute, unknown, potential life threatening injury Recommend ER for imaging and treatment options for worsening infection vs DVT/blood clot       No follow-ups on file.  Addressed extensive list of chronic and acute medical problems today requiring 40 minutes reviewing his medical record, counseling patient regarding his conditions and coordination of care.    Leilani Merl, FNP, have reviewed all documentation for this visit. The documentation on 07/14/23 for the exam, diagnosis, procedures, and orders are all accurate and complete.  Jacky Kindle, FNP  [facility] [phone] (phone) [phone] (fax)  [facility]

## 2023-07-14 NOTE — Assessment & Plan Note (Signed)
Acute, unknown, potential life threatening injury Recommend ER for imaging and treatment options for worsening infection vs DVT/blood clot

## 2023-07-15 DIAGNOSIS — S36119A Unspecified injury of liver, initial encounter: Secondary | ICD-10-CM | POA: Insufficient documentation

## 2023-07-15 DIAGNOSIS — F17218 Nicotine dependence, cigarettes, with other nicotine-induced disorders: Secondary | ICD-10-CM | POA: Diagnosis not present

## 2023-07-15 DIAGNOSIS — L03115 Cellulitis of right lower limb: Secondary | ICD-10-CM | POA: Insufficient documentation

## 2023-07-15 DIAGNOSIS — R591 Generalized enlarged lymph nodes: Secondary | ICD-10-CM | POA: Diagnosis not present

## 2023-07-15 DIAGNOSIS — K7200 Acute and subacute hepatic failure without coma: Secondary | ICD-10-CM | POA: Diagnosis not present

## 2023-07-15 DIAGNOSIS — K820 Obstruction of gallbladder: Secondary | ICD-10-CM | POA: Diagnosis not present

## 2023-07-15 DIAGNOSIS — R590 Localized enlarged lymph nodes: Secondary | ICD-10-CM | POA: Diagnosis not present

## 2023-07-15 DIAGNOSIS — R1909 Other intra-abdominal and pelvic swelling, mass and lump: Secondary | ICD-10-CM | POA: Diagnosis not present

## 2023-07-15 DIAGNOSIS — R1030 Lower abdominal pain, unspecified: Secondary | ICD-10-CM | POA: Diagnosis not present

## 2023-07-15 DIAGNOSIS — G8929 Other chronic pain: Secondary | ICD-10-CM | POA: Diagnosis not present

## 2023-07-17 ENCOUNTER — Ambulatory Visit: Payer: Self-pay | Admitting: Physician Assistant

## 2023-07-17 ENCOUNTER — Encounter: Payer: Self-pay | Admitting: Physician Assistant

## 2023-07-17 VITALS — BP 127/90 | HR 82 | Temp 97.9°F | Resp 12

## 2023-07-17 DIAGNOSIS — L02419 Cutaneous abscess of limb, unspecified: Secondary | ICD-10-CM

## 2023-07-17 NOTE — Progress Notes (Signed)
   Subjective: Follow-up for cellulitis right lower leg    Patient ID: Larry Bailey, male    DOB: Feb 18, 1986, 37 y.o.   MRN: [id number]  HPI    Review of Systems     Objective:   Physical Exam        Assessment & Plan:

## 2023-07-17 NOTE — Progress Notes (Signed)
Went to his PCP on 07/14/23 - increased swelling & redness.  She advised him to go to ED.  On 07/14/23 went to [facility] - was admitted for Friday night.  States they wanted him to stay another night, but he didn't.  He didn't receive any work restrictions from Verna Czech, NP or from [facility].  States he told them he was coming here today.  Denies pain at present.  Leg still marked from [facility].  States redness or swelling never exceeded the markings.  States it looks a lot better.  Also, states lymph node in right groin no longer hurting & swelling has diminished.  States they told him his liver enzymes were elevated & advised him to have follow-up bilirubin.  AMD

## 2023-07-17 NOTE — Progress Notes (Signed)
   Subjective: Follow-up for cellulitis right lower leg    Patient ID: Larry Bailey, male    DOB: 1986/03/02, 37 y.o.   MRN: [id number]  HPI Patient presents to clinic for follow up of right leg cellulitis and right inguinal lymphadenopathy. Patient was seen in this office on 07/12/2023 and started on Doxycycline 100mg  BID, but reported worsening of symptoms. He presented to his PCP on 07/14/2023 and was referred to the ED for further evaluation. IV vancomycin was initiated however not completed due to patient request for discharge. Pt was transitioned to 10-day course of linezolid.   Patient denies fatigue, fever, and increased discomfort. Reports inguinal lymphadenopathy is reduced.    Review of Systems  Constitutional: Negative.   HENT: Negative.    Respiratory: Negative.    Cardiovascular: Negative.   Skin:  Positive for rash.       RLE cellulitis  Neurological: Negative.        Objective:   Physical Exam Constitutional:      Appearance: He is not ill-appearing.  Cardiovascular:     Rate and Rhythm: Normal rate.  Pulmonary:     Effort: Pulmonary effort is normal.     Breath sounds: Normal breath sounds.  Musculoskeletal:        General: Swelling and tenderness present.     Cervical back: Normal range of motion.     Right lower leg: Edema present.     Comments: RLE extremity cellulitis, inguinal lymphadenopathy  Skin:    Findings: Erythema and rash present.  Neurological:     General: No focal deficit present.     Mental Status: He is alert and oriented to person, place, and time.  Psychiatric:        Mood and Affect: Mood normal.        Behavior: Behavior normal.       Assessment & Plan:   Patient advised to monitor for worsening to site and notify office. Repeat CBC, CMP ordered. Follow up scheduled 07/19/2023.

## 2023-07-18 LAB — CMP12+LP+TP+TSH+6AC+CBC/D/PLT
ALT: 99 [IU]/L — ABNORMAL HIGH (ref 0–44)
AST: 69 [IU]/L — ABNORMAL HIGH (ref 0–40)
Albumin: 3.9 g/dL — ABNORMAL LOW (ref 4.1–5.1)
Alkaline Phosphatase: 369 [IU]/L — ABNORMAL HIGH (ref 44–121)
BUN/Creatinine Ratio: 13 (ref 9–20)
BUN: 8 mg/dL (ref 6–20)
Basophils Absolute: 0.1 10*3/uL (ref 0.0–0.2)
Basos: 1 %
Bilirubin Total: 7.4 mg/dL — ABNORMAL HIGH (ref 0.0–1.2)
Calcium: 9.1 mg/dL (ref 8.7–10.2)
Chloride: 99 mmol/L (ref 96–106)
Chol/HDL Ratio: 8.3 {ratio} — ABNORMAL HIGH (ref 0.0–5.0)
Cholesterol, Total: 208 mg/dL — ABNORMAL HIGH (ref 100–199)
Creatinine, Ser: 0.61 mg/dL — ABNORMAL LOW (ref 0.76–1.27)
EOS (ABSOLUTE): 0.6 10*3/uL — ABNORMAL HIGH (ref 0.0–0.4)
Eos: 5 %
Estimated CHD Risk: 1.7 times avg. — ABNORMAL HIGH (ref 0.0–1.0)
Free Thyroxine Index: 2.6 (ref 1.2–4.9)
GGT: 216 [IU]/L — ABNORMAL HIGH (ref 0–65)
Globulin, Total: 2.5 g/dL (ref 1.5–4.5)
Glucose: 89 mg/dL (ref 70–99)
HDL: 25 mg/dL — ABNORMAL LOW (ref >39)
Hematocrit: 43.4 % (ref 37.5–51.0)
Hemoglobin: 15 g/dL (ref 13.0–17.7)
Immature Grans (Abs): 0.2 10*3/uL — ABNORMAL HIGH (ref 0.0–0.1)
Immature Granulocytes: 2 %
Iron: 150 ug/dL (ref 38–169)
LDH: 242 [IU]/L — ABNORMAL HIGH (ref 121–224)
LDL Chol Calc (NIH): 156 mg/dL — ABNORMAL HIGH (ref 0–99)
Lymphocytes Absolute: 1.6 10*3/uL (ref 0.7–3.1)
Lymphs: 14 %
MCH: 32.8 pg (ref 26.6–33.0)
MCHC: 34.6 g/dL (ref 31.5–35.7)
MCV: 95 fL (ref 79–97)
Monocytes Absolute: 0.8 10*3/uL (ref 0.1–0.9)
Monocytes: 7 %
Neutrophils Absolute: 8.2 10*3/uL — ABNORMAL HIGH (ref 1.4–7.0)
Neutrophils: 71 %
Phosphorus: 2.8 mg/dL (ref 2.8–4.1)
Platelets: 228 10*3/uL (ref 150–450)
Potassium: 3.4 mmol/L — ABNORMAL LOW (ref 3.5–5.2)
RBC: 4.58 x10E6/uL (ref 4.14–5.80)
RDW: 13.4 % (ref 11.6–15.4)
Sodium: 139 mmol/L (ref 134–144)
T3 Uptake Ratio: 29 % (ref 24–39)
T4, Total: 9 ug/dL (ref 4.5–12.0)
TSH: 2.33 u[IU]/mL (ref 0.450–4.500)
Total Protein: 6.4 g/dL (ref 6.0–8.5)
Triglycerides: 148 mg/dL (ref 0–149)
Uric Acid: 2.3 mg/dL — ABNORMAL LOW (ref 3.8–8.4)
VLDL Cholesterol Cal: 27 mg/dL (ref 5–40)
WBC: 11.4 10*3/uL — ABNORMAL HIGH (ref 3.4–10.8)
eGFR: 128 mL/min/{1.73_m2} (ref >59)

## 2023-07-19 ENCOUNTER — Ambulatory Visit: Payer: Self-pay | Admitting: Physician Assistant

## 2023-07-19 ENCOUNTER — Encounter: Payer: Self-pay | Admitting: Physician Assistant

## 2023-07-19 ENCOUNTER — Ambulatory Visit: Payer: Self-pay

## 2023-07-19 VITALS — BP 146/96 | HR 79 | Temp 98.2°F | Resp 14 | Ht 71.0 in | Wt 194.0 lb

## 2023-07-19 DIAGNOSIS — T364X5A Adverse effect of tetracyclines, initial encounter: Secondary | ICD-10-CM | POA: Diagnosis not present

## 2023-07-19 DIAGNOSIS — L299 Pruritus, unspecified: Secondary | ICD-10-CM | POA: Diagnosis not present

## 2023-07-19 DIAGNOSIS — K7689 Other specified diseases of liver: Secondary | ICD-10-CM | POA: Diagnosis not present

## 2023-07-19 DIAGNOSIS — G8929 Other chronic pain: Secondary | ICD-10-CM | POA: Diagnosis not present

## 2023-07-19 DIAGNOSIS — R7989 Other specified abnormal findings of blood chemistry: Secondary | ICD-10-CM | POA: Diagnosis not present

## 2023-07-19 DIAGNOSIS — R1011 Right upper quadrant pain: Secondary | ICD-10-CM

## 2023-07-19 DIAGNOSIS — M5430 Sciatica, unspecified side: Secondary | ICD-10-CM | POA: Diagnosis not present

## 2023-07-19 DIAGNOSIS — F17210 Nicotine dependence, cigarettes, uncomplicated: Secondary | ICD-10-CM | POA: Diagnosis not present

## 2023-07-19 DIAGNOSIS — K7589 Other specified inflammatory liver diseases: Secondary | ICD-10-CM | POA: Diagnosis not present

## 2023-07-19 DIAGNOSIS — R932 Abnormal findings on diagnostic imaging of liver and biliary tract: Secondary | ICD-10-CM | POA: Diagnosis not present

## 2023-07-19 DIAGNOSIS — Z720 Tobacco use: Secondary | ICD-10-CM | POA: Diagnosis not present

## 2023-07-19 DIAGNOSIS — K8020 Calculus of gallbladder without cholecystitis without obstruction: Secondary | ICD-10-CM | POA: Diagnosis not present

## 2023-07-19 DIAGNOSIS — K710 Toxic liver disease with cholestasis: Secondary | ICD-10-CM | POA: Diagnosis not present

## 2023-07-19 DIAGNOSIS — R748 Abnormal levels of other serum enzymes: Secondary | ICD-10-CM

## 2023-07-19 DIAGNOSIS — J45909 Unspecified asthma, uncomplicated: Secondary | ICD-10-CM | POA: Diagnosis not present

## 2023-07-19 DIAGNOSIS — S36119A Unspecified injury of liver, initial encounter: Secondary | ICD-10-CM | POA: Diagnosis not present

## 2023-07-19 DIAGNOSIS — R053 Chronic cough: Secondary | ICD-10-CM | POA: Diagnosis not present

## 2023-07-19 DIAGNOSIS — F1120 Opioid dependence, uncomplicated: Secondary | ICD-10-CM | POA: Diagnosis not present

## 2023-07-19 DIAGNOSIS — L03115 Cellulitis of right lower limb: Secondary | ICD-10-CM | POA: Diagnosis not present

## 2023-07-19 DIAGNOSIS — R590 Localized enlarged lymph nodes: Secondary | ICD-10-CM | POA: Diagnosis not present

## 2023-07-19 LAB — POCT URINALYSIS DIPSTICK
Blood, UA: POSITIVE
Glucose, UA: POSITIVE — AB
Ketones, UA: NEGATIVE
Nitrite, UA: NEGATIVE
Protein, UA: POSITIVE — AB
Spec Grav, UA: 1.025 (ref 1.010–1.025)
Urobilinogen, UA: 1 U/dL
pH, UA: 6 (ref 5.0–8.0)

## 2023-07-19 NOTE — Progress Notes (Signed)
Subjective: Follow-up cellulitis    Patient ID: Larry Bailey, male    DOB: 1986/01/24, 37 y.o.   MRN: [id number]  HPI Patient is follow-up for cellulitis of the right lower extremity which was first noticed on 07/12/2023.  Patient was placed on oral antibiotics but condition worsened and on 07/14/2023 admitted to hospital for cellulitis and abscess.  Patient was given IV antibiotics and was recommended to stay in the hospital.  Patient refused to stay more than 1 day in the hospital and was released on oral antibiotics (Zyvox). It was noted prior to discharge the patient liver enzymes and bilirubin were elevated.  Patient was seen again on 07/17/2023 to clinic was placed on a sedentary work schedule and advised to follow-up today after having repeat labs.  Labs reveal increase in liver enzymes and bilirubin.  Patient reports dark urine.  Patient also complain of a yellowish tint  to his eyes when placing contact lenses today..  Denies fever/ chills associated complaint.  Patient states he is feeling fatigued.  Has continue taking your antibiotics.   Review of Systems Cellulitis    Objective:   Physical Exam BP 146/96  Pulse 79  Resp 14  Weight 194 lb (88 kg)  Height 5\' 11"  (1.803 m)            Component Ref Range & Units 2 d ago (07/17/23) 1 mo ago (06/15/23) 1 mo ago (06/15/23) 1 mo ago (06/15/23) 1 mo ago (06/15/23) 5 mo ago (02/03/23) 1 yr ago (04/20/22)  Glucose 70 - 99 mg/dL 89   88  90 82  Uric Acid 3.8 - 8.4 mg/dL 2.3 Low      4.5 CM   Comment:            Therapeutic target for gout patients: <6.0  BUN 6 - 20 mg/dL 8   16  16 13   Creatinine, Ser 0.76 - 1.27 mg/dL 6.96 Low    2.95  2.84 Low  0.81  eGFR >59 mL/min/1.73 128   118  120 118  BUN/Creatinine Ratio 9 - 20 13   20  22  High  16  Sodium 134 - 144 mmol/L 139   139  138 141  Potassium 3.5 - 5.2 mmol/L 3.4 Low    4.7  4.0 3.9  Chloride 96 - 106 mmol/L 99   102  98 102  Calcium 8.7 - 10.2 mg/dL 9.1   9.6  9.7 9.9   Phosphorus 2.8 - 4.1 mg/dL 2.8     2.7 Low    Total Protein 6.0 - 8.5 g/dL 6.4   6.4  7.0 7.2  Comment: **Verified by repeat analysis**  Albumin 4.1 - 5.1 g/dL 3.9 Low    4.6  4.9 5.0 R, CM  Globulin, Total 1.5 - 4.5 g/dL 2.5   1.8  2.1 2.2  Bilirubin Total 0.0 - 1.2 mg/dL 7.4 High    1.8 High   2.0 High  1.7 High   Comment: **Verified by repeat analysis**  Alkaline Phosphatase 44 - 121 IU/L 369 High    90  88 77  LDH 121 - 224 IU/L 242 High      242 High    AST 0 - 40 IU/L 69 High    26  39 40  ALT 0 - 44 IU/L 99 High    40  59 High  65 High   GGT 0 - 65 IU/L 216 High      143 High  Iron 38 - 169 ug/dL 161     096 High    Cholesterol, Total 100 - 199 mg/dL 045 High     409 811   Triglycerides 0 - 149 mg/dL 914    782 956 High    HDL >39 mg/dL 25 Low     37 Low  41   VLDL Cholesterol Cal 5 - 40 mg/dL 27    22 28    LDL Chol Calc (NIH) 0 - 99 mg/dL 213 High     92 93   Chol/HDL Ratio 0.0 - 5.0 ratio 8.3 High     4.1 CM 4.0 CM   Comment:                                   T. Chol/HDL Ratio                                             Men  Women                               1/2 Avg.Risk  3.4    3.3                                   Avg.Risk  5.0    4.4                                2X Avg.Risk  9.6    7.1                                3X Avg.Risk 23.4   11.0  Estimated CHD Risk 0.0 - 1.0 times avg. 1.7 High      0.7 CM   Comment: The CHD Risk is based on the T. Chol/HDL ratio. Other factors affect CHD Risk such as hypertension, smoking, diabetes, severe obesity, and family history of premature CHD.  TSH 0.450 - 4.500 uIU/mL 2.330 1.880    2.630   T4, Total 4.5 - 12.0 ug/dL 9.0     8.5   T3 Uptake Ratio 24 - 39 % 29     28   Free Thyroxine Index 1.2 - 4.9 2.6     2.4   WBC 3.4 - 10.8 x10E3/uL 11.4 High   5.8   9.3   RBC 4.14 - 5.80 x10E6/uL 4.58  4.89   5.39   Hemoglobin 13.0 - 17.7 g/dL 08.6  57.8   46.9   Hematocrit 37.5 - 51.0 % 43.4  47.8   50.9    MCV 79 - 97 fL 95  98 High    94   MCH 26.6 - 33.0 pg 32.8  32.9   31.0   MCHC 31.5 - 35.7 g/dL 62.9  52.8   41.3   RDW 11.6 - 15.4 % 13.4  12.8   12.5   Platelets 150 - 450 x10E3/uL 228  155   187   Neutrophils Not Estab. % 71  45   56   Lymphs Not Estab. % 14  39  31   Monocytes Not Estab. % 7  9   8    Eos Not Estab. % 5  6   4    Basos Not Estab. % 1  1   1    Neutrophils Absolute 1.4 - 7.0 x10E3/uL 8.2 High   2.7   5.2   Lymphocytes Absolute 0.7 - 3.1 x10E3/uL 1.6  2.3   2.9   Monocytes Absolute 0.1 - 0.9 x10E3/uL 0.8  0.5   0.8   EOS (ABSOLUTE) 0.0 - 0.4 x10E3/uL 0.6 High   0.4   0.4   Basophils Absolute 0.0 - 0.2 x10E3/uL 0.1  0.1   0.1   Immature Granulocytes Not Estab. % 2  0   0   Immature Grans (Abs)                  Component Ref Range & Units 08:32 5 mo ago 1 yr ago  Color, UA amber Yellow Dark Public affairs consultant, UA hazy Clear Clear  Glucose, UA Negative Positive Abnormal  Negative Negative  Comment: 100mg /dL  Bilirubin, UA 2+ Negative Negative  Ketones, UA neg Negative Negative  Spec Grav, UA 1.010 - 1.025 1.025 <=1.005 Abnormal  1.020  Blood, UA pos Negative Negative  Comment: 10Ery/uL  pH, UA 5.0 - 8.0 6.0 6.0 6.5  Protein, UA Negative Positive Abnormal  Negative Positive Abnormal  CM  Comment: 15 mg/dL  Urobilinogen, UA 0.2 or 1.0 E.U./dL 1.0 0.2 0.2  Nitrite, UA neg Negative Negative  Leukocytes, UA Negative Small (1+) Abnormal  Negative Negative  Appearance dark           Appears malaised. HEENT is remarkable for mild yellowish conjunctiva. Neck is supple followed lymphadenectomy.   Lungs are clear to auscultation. Heart regular rate and rhythm. Abdomen with negative HSM, normoactive bowel sounds, mild to moderate guarding with palpation right upper quadrant.       Assessment & Plan: Liver inflammation  Advised patient to report urine send emergency room for further evaluation and consider to admission.

## 2023-07-19 NOTE — Telephone Encounter (Signed)
  Chief Complaint: was advised to go to ED pt wanting referral instead Symptoms: redness and swelling to right lower leg, elevated liver enzymes and elevated WBC  Pertinent Negatives: Patient denies fever, groin pain Disposition: [x] ED /[] Urgent Care (no appt availability in office) / [] Appointment(In office/virtual)/ []  [place] [facility]/ [] Home Care/ [] Refused Recommended Disposition /[] [place] [facility]/ []  Follow-up with PCP Additional Notes: discussed with pt to go to ED. Pt stated he was advised Pt saw Nona Dell PA with [facility] doctors and was advised to return to ED. Pt left [facility] 07/15/23 Advised that will notify for a referral. Advised pt that he is at risk for worsening cellulitis and with his liver enzymes being elevated he needs to be at the ER. Pt stated he will go.  Reason for Disposition  [1] Caller requesting NON-URGENT health information AND [2] PCP's office is the best resource  Answer Assessment - Initial Assessment Questions 1. REASON FOR CALL or QUESTION: "What is your reason for calling today?" or "How can I best help you?" or "What question do you have that I can help answer?"     Pt does not want to go to ED. Wanting referral instead.  Protocols used: Information Only Call - No Triage-A-AH

## 2023-07-20 NOTE — Telephone Encounter (Signed)
Called patient to see if he went to the ED. Per patient he is currently at [facility] Ed since yesterday.

## 2023-07-24 ENCOUNTER — Ambulatory Visit: Payer: Self-pay | Admitting: Physician Assistant

## 2023-07-24 ENCOUNTER — Encounter: Payer: Self-pay | Admitting: Physician Assistant

## 2023-07-24 VITALS — BP 143/91 | HR 68 | Temp 97.7°F | Resp 14 | Ht 71.0 in | Wt 194.0 lb

## 2023-07-24 DIAGNOSIS — L03115 Cellulitis of right lower limb: Secondary | ICD-10-CM

## 2023-07-24 NOTE — Progress Notes (Signed)
   Subjective: Return to work evaluation    Patient ID: Larry Bailey, male    DOB: 26-Oct-1985, 37 y.o.   MRN: [id number]  HPI Patient request return to work after being released from the from the hospital secondary to cellulitis and hepatic injury.  Patient will continue lab monitoring by [facility].  Patient has a consult to hepatology next week.  Review of Systems Cellulitis and liver injury    Objective:   Physical Exam BP 143/91  Pulse 68  Resp 14  Temp 97.7 F (36.5 C)  SpO2 97 %  Weight 194 lb (88 kg)  Height 5\' 11"  (1.803 m)   BMI 27.06 kg/m2  BSA 2.10 m2     [facility] Outside Information    suggestion  Information displayed in this report may not trend or trigger automated decision support.     Contains abnormal data Comprehensive Metabolic Panel Order: [id number] Component Ref Range & Units 3 d ago  Sodium 135 - 145 mmol/L 142  Potassium 3.5 - 5.1 mmol/L 4.6  Chloride 98 - 107 mmol/L 107  CO2 20.0 - 31.0 mmol/L 29.5  Anion Gap 5 - 14 mmol/L 6  BUN 9 - 23 mg/dL 11  Creatinine 7.82 - 9.56 mg/dL 2.13 Low   BUN/Creatinine Ratio 17  eGFR CKD-EPI (2021) Male >=60 mL/min/1.59m2 >90  Comment: eGFR calculated with CKD-EPI 2021 equation in accordance with [employer] and [employer] of Nephrology Task Force recommendations.  Glucose 70 - 179 mg/dL 086  Calcium 8.7 - 57.8 mg/dL 46.9  Albumin 3.4 - 5.0 g/dL 3.4  Total Protein 5.7 - 8.2 g/dL 7.6  Total Bilirubin 0.3 - 1.2 mg/dL 7.0 High   AST <=62 U/L 88 High   ALT 10 - 49 U/L 153 High   Alkaline Phosphatase 46 - 116 U/L 341 High   Resulting Agency [facility]   Specimen Collected: 07/21/23 09:18   Performed by: [facility] Last Resulted: 07/21/23 09:46  Received From: [facility]  Result Received: 07/24/23 07:57   V Comprehensive Metabolic Panel (Order #[id number]) on 07/21/23         Assessment & Plan: Resolving cellulitis.  Liver  injury  Trial of full duties.  Patient will follow-up with scheduled consult to hepatology.

## 2023-07-24 NOTE — Progress Notes (Signed)
Post hospitalization at [facility] at [place] and out of work.  Denies any complaints and abnormal liver panel noted in hospitalization.  Stated he's ready to go back to work.

## 2023-07-28 DIAGNOSIS — S36119D Unspecified injury of liver, subsequent encounter: Secondary | ICD-10-CM | POA: Diagnosis not present

## 2023-08-04 DIAGNOSIS — S36119S Unspecified injury of liver, sequela: Secondary | ICD-10-CM | POA: Diagnosis not present

## 2023-08-04 DIAGNOSIS — S36119D Unspecified injury of liver, subsequent encounter: Secondary | ICD-10-CM | POA: Diagnosis not present

## 2023-08-04 DIAGNOSIS — K719 Toxic liver disease, unspecified: Secondary | ICD-10-CM | POA: Diagnosis not present

## 2023-08-15 DIAGNOSIS — F1120 Opioid dependence, uncomplicated: Secondary | ICD-10-CM | POA: Diagnosis not present

## 2023-09-08 ENCOUNTER — Ambulatory Visit: Payer: 59 | Admitting: Physician Assistant

## 2023-09-11 DIAGNOSIS — F1120 Opioid dependence, uncomplicated: Secondary | ICD-10-CM | POA: Diagnosis not present

## 2023-09-21 ENCOUNTER — Other Ambulatory Visit: Payer: Self-pay | Admitting: Physician Assistant

## 2023-09-21 VITALS — Temp 97.9°F

## 2023-09-21 DIAGNOSIS — R070 Pain in throat: Secondary | ICD-10-CM

## 2023-09-21 LAB — POCT RAPID STREP A (OFFICE): Rapid Strep A Screen: POSITIVE — AB

## 2023-09-21 MED ORDER — LIDOCAINE VISCOUS HCL 2 % MT SOLN: 5.0000 mL | Freq: Four times a day (QID) | OROMUCOSAL | 0 refills | Status: DC | PRN

## 2023-09-21 MED ORDER — PSEUDOEPH-BROMPHEN-DM 30-2-10 MG/5ML PO SYRP: 5.0000 mL | ORAL_SOLUTION | Freq: Four times a day (QID) | ORAL | 0 refills | Status: DC | PRN

## 2023-09-21 MED ORDER — CLARITHROMYCIN 500 MG PO TABS: 500.0000 mg | ORAL_TABLET | Freq: Two times a day (BID) | ORAL | 0 refills | Status: DC

## 2023-09-21 NOTE — Progress Notes (Signed)
   Subjective: Sore throat    Patient ID: Larry Bailey, male    DOB: 1986-09-23, 37 y.o.   MRN: [id number]  HPI Patient complains of 3 days of sore throat.  Patient states mild to moderate discomfort with swallowing.  Denies fever/chills associated with complaint.   Review of Systems Negative except for above complaint    Objective:   Physical Exam emp 97.9 F (36.6 C)  Temp src Oral  Patient has bilateral edematous exudative tonsils.  Left cervical lymphadenectomy.  Lungs are clear to auscultation.  Heart is regular rate and rhythm.  Patient tested positive for strep pharyngitis.       Assessment & Plan: Strep pharyngitis  Patient given prescription for Biaxin, Bromfed-DM, and viscous lidocaine to take as directed.  Follow-up if no improvement.

## 2023-09-21 NOTE — Progress Notes (Signed)
Pt presents today with sore throat x 3 days. Pt states it feels like he's swallowing an egg.

## 2023-09-22 ENCOUNTER — Encounter: Payer: Self-pay | Admitting: Physician Assistant

## 2023-09-22 ENCOUNTER — Ambulatory Visit (INDEPENDENT_AMBULATORY_CARE_PROVIDER_SITE_OTHER): Payer: 59 | Admitting: Physician Assistant

## 2023-09-22 VITALS — BP 129/85 | HR 66 | Ht 71.0 in | Wt 195.0 lb

## 2023-09-22 DIAGNOSIS — R748 Abnormal levels of other serum enzymes: Secondary | ICD-10-CM

## 2023-09-22 DIAGNOSIS — E663 Overweight: Secondary | ICD-10-CM | POA: Diagnosis not present

## 2023-09-22 DIAGNOSIS — J020 Streptococcal pharyngitis: Secondary | ICD-10-CM

## 2023-09-22 DIAGNOSIS — F17200 Nicotine dependence, unspecified, uncomplicated: Secondary | ICD-10-CM | POA: Diagnosis not present

## 2023-09-22 DIAGNOSIS — R030 Elevated blood-pressure reading, without diagnosis of hypertension: Secondary | ICD-10-CM

## 2023-09-22 MED ORDER — AMOXICILLIN 500 MG PO TABS: 500.0000 mg | ORAL_TABLET | Freq: Two times a day (BID) | ORAL | 0 refills | Status: DC

## 2023-09-22 MED ORDER — FLUTICASONE PROPIONATE 50 MCG/ACT NA SUSP: 2.0000 | Freq: Every day | NASAL | 6 refills | Status: AC

## 2023-09-22 NOTE — Progress Notes (Unsigned)
Established patient visit  Patient: Larry Bailey   DOB: 06-15-1986   37 y.o. Male  MRN: [id number] Visit Date: 09/22/2023  Today's healthcare provider: Debera Lat, PA-C   Chief Complaint  Patient presents with   Medical Management of Chronic Issues    3 month follow-up. Patient reports no leg swelling since last visit and has not checked bp consistently at home. Reports other office visits have shown good reading. No symptoms reported.   Sore Throat    Tested positive through city doctor. Reports abx given caused his throat to swell. Patient reports he did stop taking it due to symptoms. Patient reports symptoms have been present for a few days due to throat being sore. Voice change and throat swelling started yesterday and swelling became worse after starting abx. Patients mother in law also tested positive which is why he went to be tested.    Subjective    Discussed the use of AI scribe software for clinical note transcription with the patient, who gave verbal consent to proceed.  History of Present Illness   The patient, with a past medical history of liver injury, high blood pressure, and elevated liver enzymes, presents with a swollen throat and jaw. He suspects it to be strep throat and was prescribed antibiotics. However, he stopped taking the antibiotics as he felt they were exacerbating his symptoms. The patient also mentions that his neck is swollen and he experiences pain when opening his mouth. He has been a smoker for a long time and has previously taken amoxicillin without any adverse reactions.           09/22/2023    3:16 PM 08/26/2015   10:05 AM 05/06/2015    3:21 PM  Depression screen PHQ 2/9  Decreased Interest 0 0 1  Down, Depressed, Hopeless 0 0 1  PHQ - 2 Score 0 0 2  Altered sleeping   3  Tired, decreased energy   3  Change in appetite   0  Feeling bad or failure about yourself    1  Trouble concentrating   3  Moving slowly or fidgety/restless   2   Suicidal thoughts   0  PHQ-9 Score   14  Difficult doing work/chores   Somewhat difficult      09/22/2023    3:16 PM  GAD 7 : Generalized Anxiety Score  Nervous, Anxious, on Edge 0  Control/stop worrying 0  Worry too much - different things 0  Trouble relaxing 0  Restless 0  Easily annoyed or irritable 0  Afraid - awful might happen 0  Total GAD 7 Score 0  Anxiety Difficulty Not difficult at all    Medications: Outpatient Medications Prior to Visit  Medication Sig   brompheniramine-pseudoephedrine-DM 30-2-10 MG/5ML syrup Take 5 mLs by mouth 4 (four) times daily as needed. Mix with 5 mL of viscous lidocaine for swish and swallow   buprenorphine-naloxone (SUBOXONE) 8-2 mg SUBL SL tablet 1 tablet 2 (two) times daily.   clarithromycin (BIAXIN) 500 MG tablet Take 1 tablet (500 mg total) by mouth 2 (two) times daily.   lidocaine (XYLOCAINE) 2 % solution Use as directed 5 mLs in the mouth or throat every 6 (six) hours as needed for mouth pain. Mix with 5 mL of Bromfed-DM for swish and swallow   No facility-administered medications prior to visit.    Review of Systems  All other systems reviewed and are negative.  Except see HPI  Objective    BP 129/85 (BP Location: Right Arm, Patient Position: Sitting, Cuff Size: Normal)   Pulse 66   Ht 5\' 11"  (1.803 m)   Wt 195 lb (88.5 kg)   SpO2 99%   BMI 27.20 kg/m     Physical Exam Vitals reviewed.  Constitutional:      General: He is not in acute distress.    Appearance: Normal appearance. He is not diaphoretic.  HENT:     Head: Normocephalic and atraumatic.  Eyes:     General: No scleral icterus.    Conjunctiva/sclera: Conjunctivae normal.  Cardiovascular:     Rate and Rhythm: Normal rate and regular rhythm.     Pulses: Normal pulses.     Heart sounds: Normal heart sounds. No murmur heard. Pulmonary:     Effort: Pulmonary effort is normal. No respiratory distress.     Breath sounds: Normal breath sounds. No  wheezing or rhonchi.  Musculoskeletal:     Cervical back: Neck supple.     Right lower leg: No edema.     Left lower leg: No edema.  Lymphadenopathy:     Cervical: No cervical adenopathy.  Skin:    General: Skin is warm and dry.     Findings: No rash.  Neurological:     Mental Status: He is alert and oriented to person, place, and time. Mental status is at baseline.  Psychiatric:        Mood and Affect: Mood normal.        Behavior: Behavior normal.      No results found for any visits on 09/22/23.  Assessment & Plan      Strep throat  - amoxicillin (AMOXIL) 500 MG tablet; Take 1 tablet (500 mg total) by mouth 2 (two) times daily.  Dispense: 20 tablet; Refill: 0 - fluticasone (FLONASE) 50 MCG/ACT nasal spray; Place 2 sprays into both nostrils daily.  Dispense: 16 g; Refill: 6     Could be due to Pharyngitis Suspected strep throat with significant throat swelling and pain. Patient has started azithromycin but reports worsening symptoms. -Discontinue azithromycin due to worsening symptoms. -Start Amoxicillin 500mg , half tablet initially to monitor for any adverse reaction, then twice daily for 10 days. -Use warm salt water gargles and baking soda drinks. -Use nasal saline spray and Flonase for local anti-inflammatory effect. -Contact doctor on Monday to report progress and consider systemic steroids if no improvement. -If symptoms worsen or breathing becomes difficult, seek emergency care.  Elevated Blood Pressure Blood pressure consistently elevated at multiple doctor visits. -Advise patient to monitor blood pressure at home. -Plan to reassess in two weeks.  Elevated Liver Enzymes Previous elevation due to suspected doxycycline-induced liver injury. -Plan to recheck liver enzymes in two weeks.  Hyperlipidemia Previous abnormal lipid panel. -Plan to recheck lipid panel in two weeks.  Tobacco Use Patient reports smoking approximately one pack per day. -Encourage  smoking cessation. Will revisit  Weight Management Patient has lost 10 pounds since August. -Encourage continued weight management efforts.  Follow-up Schedule follow-up appointment in two weeks, preferably in the morning and fasting for lab work.      Return in about 2 weeks (around 10/06/2023).    The patient was advised to call back or seek an in-person evaluation if the symptoms worsen or if the condition fails to improve as anticipated.  I discussed the assessment and treatment plan with the patient. The patient was provided an opportunity to ask questions and all were answered. The  patient agreed with the plan and demonstrated an understanding of the instructions.  I, Debera Lat, PA-C have reviewed all documentation for this visit. The documentation on  10/12/23 for the exam, diagnosis, procedures, and orders are all accurate and complete.  Debera Lat, [facility], MMS [facility] [phone] (phone) [phone] (fax)   [facility]

## 2023-10-08 NOTE — Progress Notes (Unsigned)
Established patient visit  Patient: Larry Bailey   DOB: 1986/10/02   37 y.o. Male  MRN: [id number] Visit Date: 10/09/2023  Today's healthcare provider: Debera Lat, PA-C   No chief complaint on file.  Subjective       Discussed the use of AI scribe software for clinical note transcription with the patient, who gave verbal consent to proceed.  History of Present Illness               09/22/2023    3:16 PM 08/26/2015   10:05 AM 05/06/2015    3:21 PM  Depression screen PHQ 2/9  Decreased Interest 0 0 1  Down, Depressed, Hopeless 0 0 1  PHQ - 2 Score 0 0 2  Altered sleeping   3  Tired, decreased energy   3  Change in appetite   0  Feeling bad or failure about yourself    1  Trouble concentrating   3  Moving slowly or fidgety/restless   2  Suicidal thoughts   0  PHQ-9 Score   14  Difficult doing work/chores   Somewhat difficult      09/22/2023    3:16 PM  GAD 7 : Generalized Anxiety Score  Nervous, Anxious, on Edge 0  Control/stop worrying 0  Worry too much - different things 0  Trouble relaxing 0  Restless 0  Easily annoyed or irritable 0  Afraid - awful might happen 0  Total GAD 7 Score 0  Anxiety Difficulty Not difficult at all    Medications: Outpatient Medications Prior to Visit  Medication Sig   amoxicillin (AMOXIL) 500 MG tablet Take 1 tablet (500 mg total) by mouth 2 (two) times daily.   brompheniramine-pseudoephedrine-DM 30-2-10 MG/5ML syrup Take 5 mLs by mouth 4 (four) times daily as needed. Mix with 5 mL of viscous lidocaine for swish and swallow   buprenorphine-naloxone (SUBOXONE) 8-2 mg SUBL SL tablet 1 tablet 2 (two) times daily.   clarithromycin (BIAXIN) 500 MG tablet Take 1 tablet (500 mg total) by mouth 2 (two) times daily.   fluticasone (FLONASE) 50 MCG/ACT nasal spray Place 2 sprays into both nostrils daily.   lidocaine (XYLOCAINE) 2 % solution Use as directed 5 mLs in the mouth or throat every 6 (six) hours as needed for mouth pain. Mix  with 5 mL of Bromfed-DM for swish and swallow   No facility-administered medications prior to visit.    Review of Systems All negative Except see HPI   {Insert previous labs (optional):23779} {See past labs  Heme  Chem  Endocrine  Serology  Results Review (optional):1}   Objective    There were no vitals taken for this visit. {Insert last BP/Wt (optional):23777}{See vitals history (optional):1}   Physical Exam   No results found for any visits on 10/09/23.      Assessment and Plan             No orders of the defined types were placed in this encounter.   No follow-ups on file.   The patient was advised to call back or seek an in-person evaluation if the symptoms worsen or if the condition fails to improve as anticipated.  I discussed the assessment and treatment plan with the patient. The patient was provided an opportunity to ask questions and all were answered. The patient agreed with the plan and demonstrated an understanding of the instructions.  I, Debera Lat, PA-C have reviewed all documentation for this visit. The documentation on 10/09/2023  for  the exam, diagnosis, procedures, and orders are all accurate and complete.  Debera Lat, [facility], MMS [facility] [phone] (phone) [phone] (fax)  [facility]

## 2023-10-09 ENCOUNTER — Ambulatory Visit: Payer: 59 | Admitting: Physician Assistant

## 2023-10-09 VITALS — BP 124/78 | HR 59 | Ht 71.0 in | Wt 199.1 lb

## 2023-10-09 DIAGNOSIS — E663 Overweight: Secondary | ICD-10-CM | POA: Diagnosis not present

## 2023-10-09 DIAGNOSIS — R748 Abnormal levels of other serum enzymes: Secondary | ICD-10-CM

## 2023-10-09 DIAGNOSIS — F17200 Nicotine dependence, unspecified, uncomplicated: Secondary | ICD-10-CM

## 2023-10-09 DIAGNOSIS — E7889 Other lipoprotein metabolism disorders: Secondary | ICD-10-CM

## 2023-10-09 DIAGNOSIS — Z833 Family history of diabetes mellitus: Secondary | ICD-10-CM | POA: Diagnosis not present

## 2023-10-09 DIAGNOSIS — M7989 Other specified soft tissue disorders: Secondary | ICD-10-CM

## 2023-10-09 DIAGNOSIS — R030 Elevated blood-pressure reading, without diagnosis of hypertension: Secondary | ICD-10-CM

## 2023-10-10 DIAGNOSIS — F1120 Opioid dependence, uncomplicated: Secondary | ICD-10-CM | POA: Diagnosis not present

## 2023-10-10 LAB — COMPREHENSIVE METABOLIC PANEL
ALT: 71 [IU]/L — ABNORMAL HIGH (ref 0–44)
AST: 51 [IU]/L — ABNORMAL HIGH (ref 0–40)
Albumin: 4.7 g/dL (ref 4.1–5.1)
Alkaline Phosphatase: 107 [IU]/L (ref 44–121)
BUN/Creatinine Ratio: 16 (ref 9–20)
BUN: 13 mg/dL (ref 6–20)
Bilirubin Total: 0.6 mg/dL (ref 0.0–1.2)
CO2: 26 mmol/L (ref 20–29)
Calcium: 9.8 mg/dL (ref 8.7–10.2)
Chloride: 102 mmol/L (ref 96–106)
Creatinine, Ser: 0.81 mg/dL (ref 0.76–1.27)
Globulin, Total: 2 g/dL (ref 1.5–4.5)
Glucose: 92 mg/dL (ref 70–99)
Potassium: 5.2 mmol/L (ref 3.5–5.2)
Sodium: 142 mmol/L (ref 134–144)
Total Protein: 6.7 g/dL (ref 6.0–8.5)
eGFR: 116 mL/min/{1.73_m2} (ref >59)

## 2023-10-10 LAB — CBC WITH DIFFERENTIAL/PLATELET
Basophils Absolute: 0.1 10*3/uL (ref 0.0–0.2)
Basos: 1 %
EOS (ABSOLUTE): 0.4 10*3/uL (ref 0.0–0.4)
Eos: 5 %
Hematocrit: 47.7 % (ref 37.5–51.0)
Hemoglobin: 16.4 g/dL (ref 13.0–17.7)
Immature Grans (Abs): 0 10*3/uL (ref 0.0–0.1)
Immature Granulocytes: 0 %
Lymphocytes Absolute: 3.1 10*3/uL (ref 0.7–3.1)
Lymphs: 41 %
MCH: 32.8 pg (ref 26.6–33.0)
MCHC: 34.4 g/dL (ref 31.5–35.7)
MCV: 95 fL (ref 79–97)
Monocytes Absolute: 0.6 10*3/uL (ref 0.1–0.9)
Monocytes: 8 %
Neutrophils Absolute: 3.4 10*3/uL (ref 1.4–7.0)
Neutrophils: 45 %
Platelets: 243 10*3/uL (ref 150–450)
RBC: 5 x10E6/uL (ref 4.14–5.80)
RDW: 13 % (ref 11.6–15.4)
WBC: 7.6 10*3/uL (ref 3.4–10.8)

## 2023-10-10 LAB — LIPID PANEL WITH LDL/HDL RATIO
Cholesterol, Total: 160 mg/dL (ref 100–199)
HDL: 45 mg/dL (ref >39)
LDL Chol Calc (NIH): 98 mg/dL (ref 0–99)
LDL/HDL Ratio: 2.2 {ratio} (ref 0.0–3.6)
Triglycerides: 88 mg/dL (ref 0–149)
VLDL Cholesterol Cal: 17 mg/dL (ref 5–40)

## 2023-10-11 ENCOUNTER — Encounter: Payer: Self-pay | Admitting: Physician Assistant

## 2023-11-08 DIAGNOSIS — F1120 Opioid dependence, uncomplicated: Secondary | ICD-10-CM | POA: Diagnosis not present

## 2023-11-17 ENCOUNTER — Other Ambulatory Visit: Payer: Self-pay

## 2023-11-17 DIAGNOSIS — Z0283 Encounter for blood-alcohol and blood-drug test: Secondary | ICD-10-CM

## 2023-11-17 NOTE — Progress Notes (Signed)
Part of [employer] Random drug screen pool.  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  [facility] Acct #:  [id number] [facility] Specimen #:  [id number]

## 2023-12-07 DIAGNOSIS — F1120 Opioid dependence, uncomplicated: Secondary | ICD-10-CM | POA: Diagnosis not present

## 2024-01-03 DIAGNOSIS — F1120 Opioid dependence, uncomplicated: Secondary | ICD-10-CM | POA: Diagnosis not present

## 2024-01-24 ENCOUNTER — Ambulatory Visit: Payer: 59

## 2024-01-24 DIAGNOSIS — Z0000 Encounter for general adult medical examination without abnormal findings: Secondary | ICD-10-CM

## 2024-01-24 LAB — POCT URINALYSIS DIPSTICK
Bilirubin, UA: NEGATIVE
Blood, UA: NEGATIVE
Glucose, UA: NEGATIVE
Ketones, UA: NEGATIVE
Leukocytes, UA: NEGATIVE
Nitrite, UA: NEGATIVE
Protein, UA: NEGATIVE
Spec Grav, UA: 1.01 (ref 1.010–1.025)
Urobilinogen, UA: 0.2 U/dL
pH, UA: 6 (ref 5.0–8.0)

## 2024-01-24 NOTE — Progress Notes (Signed)
 Pt completed labs & hearing for physical. Larry Bailey

## 2024-01-26 LAB — CMP12+LP+TP+TSH+6AC+CBC/D/PLT
ALT: 50 IU/L — ABNORMAL HIGH (ref 0–44)
AST: 39 IU/L (ref 0–40)
Albumin: 4.7 g/dL (ref 4.1–5.1)
Alkaline Phosphatase: 75 IU/L (ref 44–121)
BUN/Creatinine Ratio: 16 (ref 9–20)
BUN: 12 mg/dL (ref 6–20)
Basophils Absolute: 0.1 10*3/uL (ref 0.0–0.2)
Basos: 1 %
Bilirubin Total: 1.1 mg/dL (ref 0.0–1.2)
Calcium: 9.6 mg/dL (ref 8.7–10.2)
Chloride: 101 mmol/L (ref 96–106)
Chol/HDL Ratio: 3.8 ratio (ref 0.0–5.0)
Cholesterol, Total: 162 mg/dL (ref 100–199)
Creatinine, Ser: 0.74 mg/dL — ABNORMAL LOW (ref 0.76–1.27)
EOS (ABSOLUTE): 0.3 10*3/uL (ref 0.0–0.4)
Eos: 5 %
Estimated CHD Risk: 0.7 times avg. (ref 0.0–1.0)
Free Thyroxine Index: 2.3 (ref 1.2–4.9)
GGT: 96 IU/L — ABNORMAL HIGH (ref 0–65)
Globulin, Total: 2.1 g/dL (ref 1.5–4.5)
Glucose: 87 mg/dL (ref 70–99)
HDL: 43 mg/dL (ref >39)
Hematocrit: 50.6 % (ref 37.5–51.0)
Hemoglobin: 16.8 g/dL (ref 13.0–17.7)
Immature Grans (Abs): 0 10*3/uL (ref 0.0–0.1)
Immature Granulocytes: 0 %
Iron: 121 ug/dL (ref 38–169)
LDH: 222 IU/L (ref 121–224)
LDL Chol Calc (NIH): 102 mg/dL — ABNORMAL HIGH (ref 0–99)
Lymphocytes Absolute: 2.8 10*3/uL (ref 0.7–3.1)
Lymphs: 41 %
MCH: 31.8 pg (ref 26.6–33.0)
MCHC: 33.2 g/dL (ref 31.5–35.7)
MCV: 96 fL (ref 79–97)
Monocytes Absolute: 0.6 10*3/uL (ref 0.1–0.9)
Monocytes: 9 %
Neutrophils Absolute: 3 10*3/uL (ref 1.4–7.0)
Neutrophils: 44 %
Phosphorus: 3 mg/dL (ref 2.8–4.1)
Platelets: 187 10*3/uL (ref 150–450)
Potassium: 4.4 mmol/L (ref 3.5–5.2)
RBC: 5.29 x10E6/uL (ref 4.14–5.80)
RDW: 12.6 % (ref 11.6–15.4)
Sodium: 142 mmol/L (ref 134–144)
T3 Uptake Ratio: 28 % (ref 24–39)
T4, Total: 8.1 ug/dL (ref 4.5–12.0)
TSH: 3.58 u[IU]/mL (ref 0.450–4.500)
Total Protein: 6.8 g/dL (ref 6.0–8.5)
Triglycerides: 90 mg/dL (ref 0–149)
Uric Acid: 4.5 mg/dL (ref 3.8–8.4)
VLDL Cholesterol Cal: 17 mg/dL (ref 5–40)
WBC: 6.8 10*3/uL (ref 3.4–10.8)
eGFR: 120 mL/min/{1.73_m2} (ref >59)

## 2024-01-31 ENCOUNTER — Ambulatory Visit: Payer: 59 | Admitting: Student

## 2024-01-31 VITALS — BP 142/88 | HR 81 | Temp 98.6°F | Resp 14 | Wt 200.0 lb

## 2024-01-31 DIAGNOSIS — Z0000 Encounter for general adult medical examination without abnormal findings: Secondary | ICD-10-CM

## 2024-01-31 DIAGNOSIS — F1120 Opioid dependence, uncomplicated: Secondary | ICD-10-CM | POA: Diagnosis not present

## 2024-01-31 NOTE — Progress Notes (Signed)
 Here for yearly physical with provider.  Denies any compaints.

## 2024-01-31 NOTE — Progress Notes (Signed)
 Larry Bailey, 38 y.o. male, presents for wellness exam for insurance benefit PCP: Blane Bunting, PA-C   Last Labs on 01/24/2024  Component Ref Range & Units (hover) 7 d ago (01/24/24) 3 mo ago (10/09/23) 3 mo ago (10/09/23) 3 mo ago (10/09/23) 6 mo ago (07/17/23) 7 mo ago (06/15/23) 7 mo ago (06/15/23) 7 mo ago (06/15/23) 7 mo ago (06/15/23)  Glucose 87   92 89   88   Uric Acid 4.5    2.3 Low  CM      Comment:            Therapeutic target for gout patients: <6.0  BUN 12   13 8   16    Creatinine, Ser 0.74 Low    0.81 0.61 Low    0.79   eGFR 120   116 128   118   BUN/Creatinine Ratio 16   16 13   20    Sodium 142   142 139   139   Potassium 4.4   5.2 3.4 Low    4.7   Chloride 101   102 99   102   Calcium 9.6   9.8 9.1   9.6   Phosphorus 3.0    2.8      Total Protein 6.8   6.7 6.4 CM   6.4   Albumin 4.7   4.7 3.9 Low    4.6   Globulin, Total 2.1   2.0 2.5   1.8   Bilirubin Total 1.1   0.6 7.4 High  CM   1.8 High    Alkaline Phosphatase 75   107 369 High    90   LDH 222    242 High       AST 39   51 High  69 High    26   ALT 50 High    71 High  99 High    40   GGT 96 High     216 High       Iron 121    150      Cholesterol, Total 162  160  208 High     151  Triglycerides 90  88  148    123  HDL 43  45  25 Low     37 Low   VLDL Cholesterol Cal 17  17  27    22   LDL Chol Calc (NIH) 102 High   98  156 High     92  Chol/HDL Ratio 3.8    8.3 High  CM    4.1 CM  Comment:                                   T. Chol/HDL Ratio                                             Men  Women                               1/2 Avg.Risk  3.4    3.3  Avg.Risk  5.0    4.4                                2X Avg.Risk  9.6    7.1                                3X Avg.Risk 23.4   11.0  Estimated CHD Risk 0.7    1.7 High  CM      Comment: The CHD Risk is based on the T. Chol/HDL ratio. Other factors affect CHD Risk such as hypertension, smoking, diabetes, severe obesity,  and family history of premature CHD.  TSH 3.580    2.330 1.880     T4, Total 8.1    9.0      T3 Uptake Ratio 28    29      Free Thyroxine Index 2.3    2.6      WBC 6.8 7.6   11.4 High   5.8    RBC 5.29 5.00   4.58  4.89    Hemoglobin 16.8 16.4   15.0  16.1    Hematocrit 50.6 47.7   43.4  47.8    MCV 96 95   95  98 High     MCH 31.8 32.8   32.8  32.9    MCHC 33.2 34.4   34.6  33.7    RDW 12.6 13.0   13.4  12.8    Platelets 187 243   228  155    Neutrophils 44 45   71  45    Lymphs 41 41   14  39    Monocytes 9 8   7  9     Eos 5 5   5  6     Basos 1 1   1  1     Neutrophils Absolute 3.0 3.4   8.2 High   2.7    Lymphocytes Absolute 2.8 3.1   1.6  2.3    Monocytes Absolute 0.6 0.6   0.8  0.5    EOS (ABSOLUTE) 0.3 0.4   0.6 High   0.4    Basophils Absolute 0.1 0.1   0.1  0.1    Immature Granulocytes 0 0   2  0    Immature Grans (Abs) 0.0 0.0   0.2 High  CM  0.0           Component Ref Range & Units (hover) 7 d ago 6 mo ago 12 mo ago 1 yr ago  Color, UA yellow amber Yellow Dark Public affairs consultant, UA clear hazy Clear Clear  Glucose, UA Negative Positive Abnormal  CM Negative Negative  Bilirubin, UA neg 2+ Negative Negative  Ketones, UA neg neg Negative Negative  Spec Grav, UA 1.010 VC 1.025 <=1.005 Abnormal  1.020  Blood, UA neg VC pos CM Negative Negative  Comment: 25Ery  pH, UA 6.0 VC 6.0 6.0 6.5  Protein, UA Negative Positive Abnormal  CM Negative Positive Abnormal  CM  Urobilinogen, UA 0.2 1.0 0.2 0.2  Nitrite, UA neg neg Negative Negative  Leukocytes, UA Negative Small (1+) Abnormal  Negative Negative  Appearance light dark    Odor none        Vaccinations  There is no immunization history on file for this patient.    Health Maintenance Health Maintenance  Topic Date  Due   Pneumococcal Vaccine 38-79 Years old (1 of 2 - PCV) Never done   COVID-19 Vaccine (1 - 2024-25 season) Never done   Hepatitis C Screening  02/08/2024 (Originally 08/27/2004)   HIV Screening   02/08/2024 (Originally 08/27/2001)   INFLUENZA VACCINE  05/17/2024   HPV VACCINES  Aged Out   Meningococcal B Vaccine  Aged Out   DTaP/Tdap/Td  Discontinued      Lifestyle: Diet: none Exercise: none  Tobacco Use: 1.5 pack/ day  Social History   Tobacco Use  Smoking Status Every Day   Types: Cigarettes  Smokeless Tobacco Never     Alcohol use: rarely   Social History   Substance and Sexual Activity  Alcohol Use No   Alcohol/week: 0.0 standard drinks of alcohol      Current Vital signs:  Vitals:   01/31/24 0820  Weight: 200 lb (90.7 kg)     Previous vital signs:     01/31/2024    8:20 AM  Vitals with BMI  Weight 200 lbs  Systolic 142  Diastolic 88  Pulse 81      Past Medical History:  Diagnosis Date   Anxiety    Asthma    Depression    GERD (gastroesophageal reflux disease)      Current Outpatient Medications on File Prior to Visit  Medication Sig Dispense Refill   buprenorphine-naloxone (SUBOXONE) 8-2 mg SUBL SL tablet 1 tablet 2 (two) times daily.     fluticasone (FLONASE) 50 MCG/ACT nasal spray Place 2 sprays into both nostrils daily. 16 g 6   No current facility-administered medications on file prior to visit.       Physical Exam Constitutional:      Appearance: Normal appearance. He is not ill-appearing.  HENT:     Head: Normocephalic and atraumatic.     Right Ear: Tympanic membrane, ear canal and external ear normal.     Left Ear: Tympanic membrane, ear canal and external ear normal.     Nose: Nose normal.     Mouth/Throat:     Mouth: Mucous membranes are dry.     Pharynx: Oropharynx is clear.  Eyes:     Extraocular Movements: Extraocular movements intact.     Conjunctiva/sclera: Conjunctivae normal.     Pupils: Pupils are equal, round, and reactive to light.  Cardiovascular:     Rate and Rhythm: Normal rate and regular rhythm.     Pulses: Normal pulses.     Heart sounds: Normal heart sounds.  Pulmonary:     Effort: Pulmonary  effort is normal.     Breath sounds: Normal breath sounds.  Abdominal:     General: Abdomen is flat. Bowel sounds are normal.     Palpations: Abdomen is soft.  Musculoskeletal:        General: No tenderness. Normal range of motion.     Cervical back: Normal range of motion and neck supple. No tenderness.  Skin:    General: Skin is warm and dry.  Neurological:     General: No focal deficit present.     Mental Status: He is alert and oriented to person, place, and time.     Motor: No weakness.  Psychiatric:        Mood and Affect: Mood normal.        Behavior: Behavior normal.      Assessment/Plan/Recommendation: Presents for annual physical. No new medications of history since last. No concerns at this tie other than sinus congestion that  started 2 days ago. Continue flonase. Reviewed labs and urinalysis. Advised to review with PCP.   Health Maintenance Due  Topic Date Due   Pneumococcal Vaccine 40-45 Years old (1 of 2 - PCV) Never done   COVID-19 Vaccine (1 - 2024-25 season) Never done

## 2024-02-14 ENCOUNTER — Other Ambulatory Visit: Payer: Self-pay

## 2024-02-14 DIAGNOSIS — Z0283 Encounter for blood-alcohol and blood-drug test: Secondary | ICD-10-CM

## 2024-02-14 NOTE — Progress Notes (Signed)
 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

## 2024-02-28 DIAGNOSIS — F1120 Opioid dependence, uncomplicated: Secondary | ICD-10-CM | POA: Diagnosis not present

## 2024-03-28 DIAGNOSIS — F1120 Opioid dependence, uncomplicated: Secondary | ICD-10-CM | POA: Diagnosis not present

## 2024-04-08 ENCOUNTER — Ambulatory Visit: Payer: Self-pay | Admitting: Physician Assistant

## 2024-04-08 NOTE — Progress Notes (Deleted)
 Established patient visit  Patient: Larry Bailey   DOB: 08/21/1986   38 y.o. Male  MRN: [id number] Visit Date: 04/08/2024  Today's healthcare provider: Jolynn Spencer, PA-C   No chief complaint on file.  Subjective       Discussed the use of AI scribe software for clinical note transcription with the patient, who gave verbal consent to proceed.  History of Present Illness        09/22/2023    3:16 PM 08/26/2015   10:05 AM 05/06/2015    3:21 PM  Depression screen PHQ 2/9  Decreased Interest 0 0 1  Down, Depressed, Hopeless 0 0 1  PHQ - 2 Score 0 0 2  Altered sleeping   3  Tired, decreased energy   3  Change in appetite   0  Feeling bad or failure about yourself    1  Trouble concentrating   3  Moving slowly or fidgety/restless   2  Suicidal thoughts   0   PHQ-9 Score   14  Difficult doing work/chores   Somewhat difficult     Data saved with a previous flowsheet row definition      09/22/2023    3:16 PM  GAD 7 : Generalized Anxiety Score  Nervous, Anxious, on Edge 0  Control/stop worrying 0  Worry too much - different things 0  Trouble relaxing 0  Restless 0  Easily annoyed or irritable 0  Afraid - awful might happen 0  Total GAD 7 Score 0  Anxiety Difficulty Not difficult at all    Medications: Outpatient Medications Prior to Visit  Medication Sig  . buprenorphine-naloxone (SUBOXONE) 8-2 mg SUBL SL tablet 1 tablet 2 (two) times daily.  . fluticasone  (FLONASE ) 50 MCG/ACT nasal spray Place 2 sprays into both nostrils daily.   No facility-administered medications prior to visit.    Review of Systems  All other systems reviewed and are negative. All negative Except see HPI   {Insert previous labs (optional):23779} {See past labs  Heme  Chem  Endocrine  Serology  Results Review (optional):1}   Objective    There were no vitals taken for this visit. {Insert last BP/Wt (optional):23777}{See vitals history (optional):1}   Physical Exam Vitals  reviewed.  Constitutional:      General: He is not in acute distress.    Appearance: Normal appearance. He is not diaphoretic.  HENT:     Head: Normocephalic and atraumatic.   Eyes:     General: No scleral icterus.    Conjunctiva/sclera: Conjunctivae normal.    Cardiovascular:     Rate and Rhythm: Normal rate and regular rhythm.     Pulses: Normal pulses.     Heart sounds: Normal heart sounds. No murmur heard. Pulmonary:     Effort: Pulmonary effort is normal. No respiratory distress.     Breath sounds: Normal breath sounds. No wheezing or rhonchi.   Musculoskeletal:     Cervical back: Neck supple.     Right lower leg: No edema.     Left lower leg: No edema.  Lymphadenopathy:     Cervical: No cervical adenopathy.   Skin:    General: Skin is warm and dry.     Findings: No rash.   Neurological:     Mental Status: He is alert and oriented to person, place, and time. Mental status is at baseline.   Psychiatric:        Mood and Affect: Mood normal.  Behavior: Behavior normal.     No results found for any visits on 04/08/24.      Assessment and Plan Assessment & Plan     No orders of the defined types were placed in this encounter.   No follow-ups on file.   The patient was advised to call back or seek an in-person evaluation if the symptoms worsen or if the condition fails to improve as anticipated.  I discussed the assessment and treatment plan with the patient. The patient was provided an opportunity to ask questions and all were answered. The patient agreed with the plan and demonstrated an understanding of the instructions.  I, Sway Guttierrez, PA-C have reviewed all documentation for this visit. The documentation on 04/08/2024  for the exam, diagnosis, procedures, and orders are all accurate and complete.  Jolynn Spencer, [facility], MMS [facility] [phone] (phone) [phone] (fax)  [facility]

## 2024-04-23 DIAGNOSIS — F1120 Opioid dependence, uncomplicated: Secondary | ICD-10-CM | POA: Diagnosis not present
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
## 2024-05-15 DIAGNOSIS — F1120 Opioid dependence, uncomplicated: Secondary | ICD-10-CM | POA: Diagnosis not present

## 2024-05-21 ENCOUNTER — Ambulatory Visit (INDEPENDENT_AMBULATORY_CARE_PROVIDER_SITE_OTHER): Payer: Self-pay | Admitting: Physician Assistant

## 2024-05-21 ENCOUNTER — Encounter: Payer: Self-pay | Admitting: Physician Assistant

## 2024-05-21 VITALS — BP 142/90 | HR 65 | Resp 14 | Ht 71.0 in | Wt 204.0 lb

## 2024-05-21 DIAGNOSIS — K760 Fatty (change of) liver, not elsewhere classified: Secondary | ICD-10-CM | POA: Diagnosis not present

## 2024-05-21 DIAGNOSIS — F1121 Opioid dependence, in remission: Secondary | ICD-10-CM

## 2024-05-21 DIAGNOSIS — R030 Elevated blood-pressure reading, without diagnosis of hypertension: Secondary | ICD-10-CM

## 2024-05-21 DIAGNOSIS — Z114 Encounter for screening for human immunodeficiency virus [HIV]: Secondary | ICD-10-CM

## 2024-05-21 DIAGNOSIS — J42 Unspecified chronic bronchitis: Secondary | ICD-10-CM | POA: Diagnosis not present

## 2024-05-21 DIAGNOSIS — J3089 Other allergic rhinitis: Secondary | ICD-10-CM | POA: Diagnosis not present

## 2024-05-21 DIAGNOSIS — E663 Overweight: Secondary | ICD-10-CM | POA: Diagnosis not present

## 2024-05-21 DIAGNOSIS — R053 Chronic cough: Secondary | ICD-10-CM

## 2024-05-21 DIAGNOSIS — Z720 Tobacco use: Secondary | ICD-10-CM | POA: Diagnosis not present

## 2024-05-21 DIAGNOSIS — R748 Abnormal levels of other serum enzymes: Secondary | ICD-10-CM

## 2024-05-21 MED ORDER — FLUTICASONE-SALMETEROL 100-50 MCG/ACT IN AEPB: 1.0000 | INHALATION_SPRAY | Freq: Two times a day (BID) | RESPIRATORY_TRACT | 0 refills | Status: DC

## 2024-05-21 MED ORDER — ALBUTEROL SULFATE HFA 108 (90 BASE) MCG/ACT IN AERS: 2.0000 | INHALATION_SPRAY | Freq: Four times a day (QID) | RESPIRATORY_TRACT | 0 refills | Status: AC | PRN

## 2024-05-21 NOTE — Progress Notes (Unsigned)
 Established patient visit  Patient: Larry Bailey   DOB: 1986-04-14   37 y.o. Male  MRN: [id number] Visit Date: 05/21/2024  Today's healthcare provider: Jolynn Spencer, PA-C   Chief Complaint  Patient presents with   Follow-up    6 month  BP Friday at work was 124/82    Subjective     HPI     Follow-up    Additional comments: 6 month  BP Friday at work was 124/82       Last edited by Larry Bailey, CMA on 05/21/2024  8:33 AM.       Discussed the use of AI scribe software for clinical note transcription with the patient, who gave verbal consent to proceed.  History of Present Illness Larry Bailey is a 38 year old male with hypertension who presents for blood pressure management and follow-up.  His blood pressure was 124/82 mmHg on Friday morning. He manages hypertension through lifestyle changes, including exercise and a low-salt diet, and is not on medication. He consumed a salty meal today. He is reducing smoking from a pack and a half to a pack daily.  He has fatty liver and monitors his diet accordingly. Liver enzymes showed slight improvement at the last check. He abstains from alcohol .  Asthma is well-controlled. He uses Suboxone for opioid dependence with monthly telehealth appointments and attends a church group for counseling.  He has experienced sinus congestion and mucus in his throat and nose for three days, using nasal sprays for relief.  He has smoked since age 61, with a morning cough and occasional shortness of breath. He has not used an inhaler recently.  He gained weight a few months ago, reaching 204 pounds, but has since lost some weight without a specific regimen.       05/21/2024    8:30 AM 09/22/2023    3:16 PM 08/26/2015   10:05 AM  Depression screen PHQ 2/9  Decreased Interest 0 0 0  Down, Depressed, Hopeless 0 0 0  PHQ - 2 Score 0 0 0  Altered sleeping 0    Tired, decreased energy 0    Change in appetite 0    Feeling bad or failure  about yourself  0    Trouble concentrating 0    Moving slowly or fidgety/restless 0    Suicidal thoughts 0    PHQ-9 Score 0        05/21/2024    8:30 AM 09/22/2023    3:16 PM  GAD 7 : Generalized Anxiety Score  Nervous, Anxious, on Edge 0 0  Control/stop worrying 0 0  Worry too much - different things 0 0  Trouble relaxing 0 0  Restless 0 0  Easily annoyed or irritable 0 0  Afraid - awful might happen 0 0  Total GAD 7 Score 0 0  Anxiety Difficulty  Not difficult at all    Medications: Outpatient Medications Prior to Visit  Medication Sig   buprenorphine-naloxone (SUBOXONE) 8-2 mg SUBL SL tablet 1 tablet 2 (two) times daily.   fluticasone  (FLONASE ) 50 MCG/ACT nasal spray Place 2 sprays into both nostrils daily.   No facility-administered medications prior to visit.    Review of Systems All negative Except see HPI   {Insert previous labs (optional):23779} {See past labs  Heme  Chem  Endocrine  Serology  Results Review (optional):1}   Objective    BP (!) 142/90 (BP Location: Right Arm, Patient Position: Sitting, Cuff Size: Normal)   Pulse  65   Resp 14   Ht 5' 11 (1.803 m)   Wt 204 lb (92.5 kg)   SpO2 96%   BMI 28.45 kg/m  {Insert last BP/Wt (optional):23777}{See vitals history (optional):1}   Physical Exam Vitals reviewed.  Constitutional:      General: He is not in acute distress.    Appearance: Normal appearance. He is not diaphoretic.  HENT:     Head: Normocephalic and atraumatic.  Eyes:     General: No scleral icterus.    Conjunctiva/sclera: Conjunctivae normal.  Cardiovascular:     Rate and Rhythm: Normal rate and regular rhythm.     Pulses: Normal pulses.     Heart sounds: Normal heart sounds. No murmur heard. Pulmonary:     Effort: Pulmonary effort is normal. No respiratory distress.     Breath sounds: Normal breath sounds. No wheezing or rhonchi.  Musculoskeletal:     Cervical back: Neck supple.     Right lower leg: No edema.     Left  lower leg: No edema.  Lymphadenopathy:     Cervical: No cervical adenopathy.  Skin:    General: Skin is warm and dry.     Findings: No rash.  Neurological:     Mental Status: He is alert and oriented to person, place, and time. Mental status is at baseline.  Psychiatric:        Mood and Affect: Mood normal.        Behavior: Behavior normal.      No results found for any visits on 05/21/24.      Assessment & Plan Elevated blood pressure Bp today was 142/90 Blood pressure was 124/82 on Friday. Recent salty food intake may have caused temporary increase. No antihypertensive medication, managing with lifestyle modifications. Discussed risks of prolonged elevated blood pressure. - Instruct to measure blood pressure for the next two weeks. - Schedule follow-up appointment in two weeks to review blood pressure readings. - Start antihypertensive medication if blood pressure remains above 140/90. - Cancel follow-up appointment if blood pressure is consistently below 130/80.  Chronic tobacco use Currently smoking one pack per day, reduced from one and a half packs. Smoking cessation efforts ongoing.  Chronic cough Could be due to bronchitis and chronic obstructive pulmonary disease (COPD) Reports chronic cough and occasional shortness of breath. Suspected COPD due to long-term smoking history. Discussed inhaler use. Referral to pulmonologist planned. - Prescribe albuterol  inhaler for rescue use. - Prescribe maintenance inhaler for daily use. - Refer to pulmonologist for further evaluation and spirometry. - Schedule follow-up in six months to reassess respiratory status.  Fatty liver (hepatic steatosis) Fatty liver with previous Fibroscan showing fibrosis. No current alcohol  use. Advised to monitor diet and caffeine intake. - Order liver enzyme tests after fasting for 8-10 hours. - Monitor liver function periodically.  Opioid dependence on Suboxone maintenance Currently on Suboxone  with monthly telehealth follow-ups with Dr. Merline in [place]. Participates in counseling through a church group.  Allergic rhinitis Experiencing sinus congestion and mucus in throat and nose for three days. Advised on hydration and medication use for symptom relief. - Recommend Zyrtec for allergy relief. - Advise use of Flonase  nasal spray. - Encourage use of nasal saline rinse if needed.  Overweight (BMI 25.0-29.9) Chronic and stable Body mass index is 28.45 kg/m. Weight loss of 5% of pt's current weight via healthy diet and daily exercise encouraged. Recheck CMP Will follow-up  Elevated BP without diagnosis of hypertension - Comprehensive metabolic panel with  GFR  Encounter for screening for HIV Low risk screening - HIV Antibody (routine testing w rflx)   Orders Placed This Encounter  Procedures   Comprehensive metabolic panel with GFR    Has the patient fasted?:   Yes   HIV Antibody (routine testing w rflx)    Return in about 2 weeks (around 06/04/2024) for chronic disease f/u in 6 mo, BP f/u in 2 weeks.   The patient was advised to call back or seek an in-person evaluation if the symptoms worsen or if the condition fails to improve as anticipated.  I discussed the assessment and treatment plan with the patient. The patient was provided an opportunity to ask questions and all were answered. The patient agreed with the plan and demonstrated an understanding of the instructions.  I, Izick Gasbarro, PA-C have reviewed all documentation for this visit. The documentation on 05/21/2024  for the exam, diagnosis, procedures, and orders are all accurate and complete.  Larry Bailey, [facility], MMS [facility] [phone] (phone) [phone] (fax)  [facility]

## 2024-05-22 DIAGNOSIS — J309 Allergic rhinitis, unspecified: Secondary | ICD-10-CM | POA: Insufficient documentation

## 2024-05-22 DIAGNOSIS — F1121 Opioid dependence, in remission: Secondary | ICD-10-CM | POA: Insufficient documentation

## 2024-05-22 DIAGNOSIS — K760 Fatty (change of) liver, not elsewhere classified: Secondary | ICD-10-CM | POA: Insufficient documentation

## 2024-05-22 DIAGNOSIS — J42 Unspecified chronic bronchitis: Secondary | ICD-10-CM | POA: Insufficient documentation

## 2024-06-03 DIAGNOSIS — F1120 Opioid dependence, uncomplicated: Secondary | ICD-10-CM | POA: Diagnosis not present

## 2024-06-10 ENCOUNTER — Ambulatory Visit: Admitting: Physician Assistant

## 2024-06-10 VITALS — BP 138/88 | HR 68 | Temp 97.8°F | Ht 71.0 in | Wt 208.2 lb

## 2024-06-10 DIAGNOSIS — F1121 Opioid dependence, in remission: Secondary | ICD-10-CM | POA: Diagnosis not present

## 2024-06-10 DIAGNOSIS — Z720 Tobacco use: Secondary | ICD-10-CM

## 2024-06-10 DIAGNOSIS — I10 Essential (primary) hypertension: Secondary | ICD-10-CM

## 2024-06-10 MED ORDER — VALSARTAN 40 MG PO TABS: 40.0000 mg | ORAL_TABLET | Freq: Every day | ORAL | 3 refills | Status: AC

## 2024-06-10 NOTE — Progress Notes (Signed)
 Established patient visit  Patient: Larry Bailey   DOB: [birth year]   37 y.o. Male  MRN: [id number] Visit Date: 06/10/2024  Today's healthcare provider: Jolynn Spencer, PA-C   Chief Complaint  Patient presents with   Medical Management of Chronic Issues    Patient presents for BP recheck Reports No CP, headache, blurred vision or dizziness    Subjective     HPI     Medical Management of Chronic Issues    Additional comments: Patient presents for BP recheck Reports No CP, headache, blurred vision or dizziness       Last edited by Cherry Chiquita HERO, CMA on 06/10/2024  8:22 AM.       Discussed the use of AI scribe software for clinical note transcription with the patient, who gave verbal consent to proceed.  History of Present Illness Larry Bailey is a 38 year old male with hypertension who presents for blood pressure management.  Recent blood pressure readings were elevated, with a higher measurement two weeks ago compared to today. He smokes one pack of cigarettes daily, reduced from two packs. Stress is considered a contributing factor to his hypertension. He denies chest pain, shortness of breath, or palpitations. No urinary or abdominal issues are present.       05/21/2024    8:30 AM 09/22/2023    3:16 PM 08/26/2015   10:05 AM  Depression screen PHQ 2/9  Decreased Interest 0 0 0  Down, Depressed, Hopeless 0 0 0  PHQ - 2 Score 0 0 0  Altered sleeping 0    Tired, decreased energy 0    Change in appetite 0    Feeling bad or failure about yourself  0    Trouble concentrating 0    Moving slowly or fidgety/restless 0    Suicidal thoughts 0    PHQ-9 Score 0        05/21/2024    8:30 AM 09/22/2023    3:16 PM  GAD 7 : Generalized Anxiety Score  Nervous, Anxious, on Edge 0 0  Control/stop worrying 0 0  Worry too much - different things 0 0  Trouble relaxing 0 0  Restless 0 0  Easily annoyed or irritable 0 0  Afraid - awful might happen 0 0  Total GAD 7 Score 0  0  Anxiety Difficulty  Not difficult at all    Medications: Outpatient Medications Prior to Visit  Medication Sig   albuterol  (VENTOLIN  HFA) 108 (90 Base) MCG/ACT inhaler Inhale 2 puffs into the lungs every 6 (six) hours as needed for wheezing or shortness of breath.   buprenorphine-naloxone (SUBOXONE) 8-2 mg SUBL SL tablet 1 tablet 2 (two) times daily.   fluticasone  (FLONASE ) 50 MCG/ACT nasal spray Place 2 sprays into both nostrils daily.   fluticasone -salmeterol (WIXELA INHUB) 100-50 MCG/ACT AEPB Inhale 1 puff into the lungs 2 (two) times daily.   No facility-administered medications prior to visit.    Review of Systems  All other systems reviewed and are negative.  All negative Except see HPI       Objective    BP 138/88 (BP Location: Right Arm, Patient Position: Sitting, Cuff Size: Normal)   Pulse 68   Temp 97.8 F (36.6 C) (Oral)   Ht 5' 11 (1.803 m)   Wt 208 lb 3.2 oz (94.4 kg)   SpO2 99%   BMI 29.04 kg/m     Physical Exam Vitals reviewed.  Constitutional:      General: He is  not in acute distress.    Appearance: Normal appearance. He is not diaphoretic.  HENT:     Head: Normocephalic and atraumatic.  Eyes:     General: No scleral icterus.    Conjunctiva/sclera: Conjunctivae normal.  Cardiovascular:     Rate and Rhythm: Normal rate and regular rhythm.     Pulses: Normal pulses.     Heart sounds: Normal heart sounds. No murmur heard. Pulmonary:     Effort: Pulmonary effort is normal. No respiratory distress.     Breath sounds: Normal breath sounds. No wheezing or rhonchi.  Musculoskeletal:     Cervical back: Neck supple.     Right lower leg: No edema.     Left lower leg: No edema.  Lymphadenopathy:     Cervical: No cervical adenopathy.  Skin:    General: Skin is warm and dry.     Findings: No rash.  Neurological:     Mental Status: He is alert and oriented to person, place, and time. Mental status is at baseline.  Psychiatric:        Mood and  Affect: Mood normal.        Behavior: Behavior normal.      No results found for any visits on 06/10/24.      Assessment & Plan Hypertension Chronic  hypertension with recent elevated readings, stress and diet may contribute. - Prescribed low-dose antihypertensive, 40 mg, half tablet initially. - Advised daily morning blood pressure monitoring, bring readings to next appointment. - Recommended low-carb or soft diet. - Encouraged stress reduction and exercise. - Scheduled follow-up in six weeks.   Tobacco use Currently smoking one pack per day, reduced from two, attempting to quit. - Recommended CDC resources for smoking cessation. Will monitor   No orders of the defined types were placed in this encounter.   No follow-ups on file.   The patient was advised to call back or seek an in-person evaluation if the symptoms worsen or if the condition fails to improve as anticipated.  I discussed the assessment and treatment plan with the patient. The patient was provided an opportunity to ask questions and all were answered. The patient agreed with the plan and demonstrated an understanding of the instructions.  I, Achillies Buehl, PA-C have reviewed all documentation for this visit. The documentation on 06/10/2024  for the exam, diagnosis, procedures, and orders are all accurate and complete.  Jolynn Spencer, [facility], MMS [facility] [phone] (phone) [phone] (fax)  [facility]

## 2024-06-18 ENCOUNTER — Other Ambulatory Visit: Payer: Self-pay | Admitting: Physician Assistant

## 2024-06-18 DIAGNOSIS — J42 Unspecified chronic bronchitis: Secondary | ICD-10-CM

## 2024-06-18 DIAGNOSIS — R053 Chronic cough: Secondary | ICD-10-CM

## 2024-06-19 ENCOUNTER — Encounter: Payer: Self-pay | Admitting: Physician Assistant

## 2024-07-01 ENCOUNTER — Encounter: Payer: Self-pay | Admitting: Pulmonary Disease

## 2024-07-01 ENCOUNTER — Ambulatory Visit
Admission: RE | Admit: 2024-07-01 | Discharge: 2024-07-01 | Disposition: A | Discharge status: Home / Self Care | Source: Ambulatory Visit | Attending: Pulmonary Disease | Admitting: Pulmonary Disease

## 2024-07-01 ENCOUNTER — Ambulatory Visit: Admitting: Pulmonary Disease

## 2024-07-01 VITALS — BP 116/80 | HR 71 | Temp 98.0°F | Ht 71.0 in | Wt 209.4 lb

## 2024-07-01 DIAGNOSIS — J42 Unspecified chronic bronchitis: Secondary | ICD-10-CM

## 2024-07-01 DIAGNOSIS — R0602 Shortness of breath: Secondary | ICD-10-CM | POA: Diagnosis not present

## 2024-07-01 DIAGNOSIS — Z0389 Encounter for observation for other suspected diseases and conditions ruled out: Secondary | ICD-10-CM | POA: Diagnosis not present

## 2024-07-01 DIAGNOSIS — F17210 Nicotine dependence, cigarettes, uncomplicated: Secondary | ICD-10-CM

## 2024-07-01 MED ORDER — NICOTINE 14 MG/24HR TD PT24: 14.0000 mg | MEDICATED_PATCH | Freq: Every day | TRANSDERMAL | 3 refills | Status: AC

## 2024-07-01 MED ORDER — NICOTINE POLACRILEX 2 MG MT LOZG: 2.0000 mg | LOZENGE | OROMUCOSAL | 6 refills | Status: AC | PRN

## 2024-07-01 MED ORDER — BUDESONIDE-FORMOTEROL FUMARATE 160-4.5 MCG/ACT IN AERO: 2.0000 | INHALATION_SPRAY | Freq: Two times a day (BID) | RESPIRATORY_TRACT | 12 refills | Status: AC

## 2024-07-01 NOTE — Progress Notes (Signed)
 Synopsis: Referred in by Ostwalt, Janna, PA-C   Subjective:   PATIENT ID: Larry Bailey Larry Bailey: male DOB: 06/02/1986, MRN: [id number]  Chief Complaint  Patient presents with   Cough    Chronic bronchitis  Has been coughing for about a month and has had mucus production in the past week or so. Coughing daily with occasional SOB/DOE but that has lessened in the past couple weeks, and increased wheezing since he's been producing phlegm, has been taking his albuterol  which is somewhat helpful but hasn't been taking his wixela or advair (purple disc). No new allergens, but has extensive work related exposure to silica and asphalt and doesn't wear a mask    HPI Larry Bailey is a pleasant 38 year old male patient with a past medical history of asthma as a child, tobacco use presenting today pulmonary clinic for further evaluation of chronic bronchitis by his primary care physician.  He reports that he has been told he had asthma as a child but grew out of it.  He had a URI few weeks ago was complicated by chronic cough and wheezing.  This has improved but the wheezing has persisted.  He describes occasional shortness of breath specifically on exertion associated with a cough and sometimes produces phlegm that is clear or yellow.  The latter has been going on for about a year.  He does have dry skin during winter with question of eczema but no allergic rhinitis.  Family history -mom with COPD was a heavy smoker.  Social history -active smoker smokes 1 pack/day for 20 years.  Drinks alcohol  only occasionally.  Works as an Forensic scientist with asphalt and silica.  Does not have any pets at home.    Family History  Problem Relation Age of Onset   Kidney disease Maternal Grandmother        kidney failure   Diabetes Maternal Grandfather    Cancer Maternal Grandfather      Social History   Socioeconomic History   Marital status: Married    Spouse name: Not on file   Number of  children: 0   Years of education: Not on file   Highest education level: 10th grade  Occupational History   Occupation: Lube Tech    Comment: [employer]  Tobacco Use   Smoking status: Every Day    Current packs/day: 1.00    Average packs/day: 1 pack/day for 22.7 years (22.7 ttl pk-yrs)    Types: Cigarettes    Start date: 2003   Smokeless tobacco: Never   Tobacco comments:    2 ppd at his heaviest but is smoking 1 - 1.5ppd now -- 07/01/24  Substance and Sexual Activity   Alcohol  use: No    Alcohol /week: 0.0 standard drinks of alcohol    Drug use: No   Sexual activity: Yes    Partners: Female    Birth control/protection: None  Other Topics Concern   Not on file  Social History Narrative   Not on file   Social Drivers of Health   Financial Resource Strain: Low Risk  (06/10/2024)   Overall Financial Resource Strain (CARDIA)    Difficulty of Paying Living Expenses: Not hard at all  Food Insecurity: No Food Insecurity (06/10/2024)   Hunger Vital Sign    Worried About Running Out of Food in the Last Year: Never true    Ran Out of Food in the Last Year: Never true  Transportation Needs: No Transportation Needs (06/10/2024)   PRAPARE - Transportation  Lack of Transportation (Medical): No    Lack of Transportation (Non-Medical): No  Physical Activity: Insufficiently Active (06/10/2024)   Exercise Vital Sign    Days of Exercise per Week: 3 days    Minutes of Exercise per Session: 20 min  Stress: No Stress Concern Present (06/10/2024)   [employer] of Occupational Health - Occupational Stress Questionnaire    Feeling of Stress: Not at all  Social Connections: Moderately Isolated (06/10/2024)   Social Connection and Isolation Panel    Frequency of Communication with Friends and Family: More than three times a week    Frequency of Social Gatherings with Friends and Family: More than three times a week    Attends Religious Services: Never    Database administrator or  Organizations: No    Attends Engineer, structural: Not on file    Marital Status: Married  Intimate Partner Violence: Not At Risk (05/21/2024)   Humiliation, Afraid, Rape, and Kick questionnaire    Fear of Current or Ex-Partner: No    Emotionally Abused: No    Physically Abused: No    Sexually Abused: No        Objective:   Vitals:   07/01/24 1512  BP: 116/80  Pulse: 71  Temp: 98 F (36.7 C)  SpO2: 94%  Weight: 209 lb 6.4 oz (95 kg)  Height: 5' 11 (1.803 m)   94% on RA BMI Readings from Last 3 Encounters:  07/01/24 29.21 kg/m  06/10/24 29.04 kg/m  05/21/24 28.45 kg/m   Wt Readings from Last 3 Encounters:  07/01/24 209 lb 6.4 oz (95 kg)  06/10/24 208 lb 3.2 oz (94.4 kg)  05/21/24 204 lb (92.5 kg)    Physical Exam GEN: NAD, Healthy Appearing HEENT: Supple Neck, Reactive Pupils, EOMI  CVS: Normal S1, Normal S2, RRR, No murmurs or ES appreciated  Lungs: Poor air movement bilaterally Abdomen: Soft, non tender, non distended, + BS  Extremities: Warm and well perfused, No edema   Labs and imaging were reviewed  Ancillary Information   CBC    Component Value Date/Time   WBC 6.8 01/24/2024 0834   WBC 5.9 02/09/2017 0804   RBC 5.29 01/24/2024 0834   RBC 5.38 02/09/2017 0804   HGB 16.8 01/24/2024 0834   HCT 50.6 01/24/2024 0834   PLT 187 01/24/2024 0834   MCV 96 01/24/2024 0834   MCH 31.8 01/24/2024 0834   MCH 32.7 02/09/2017 0804   MCHC 33.2 01/24/2024 0834   MCHC 34.2 02/09/2017 0804   RDW 12.6 01/24/2024 0834   LYMPHSABS 2.8 01/24/2024 0834   MONOABS 590 02/09/2017 0804   EOSABS 0.3 01/24/2024 0834   BASOSABS 0.1 01/24/2024 0834        No data to display           Assessment & Plan:  Mr. Larry Bailey is a pleasant 38 year old male patient with a past medical history of asthma as a child, tobacco use presenting today pulmonary clinic for further evaluation of chronic bronchitis by his primary care physician.  # Suspecting mild to moderate  persistent asthma.  He is too young to have COPD though he has extensive smoking history. Peak eosinophil count 600 []  Obtain PFTs []  Trial Symbicort  160-4.52 puffs twice a day. []  Continue with albuterol  on as-needed basis  # Tobacco use disorder Discussed at length importance of smoking cessation and discussed strategies as well.  Prescribed nicotine  replacement therapy  Return in about 3 months (around 09/30/2024).  I personally  spent a total of 60 minutes in the care of the patient today including preparing to see the patient, getting/reviewing separately obtained history, performing a medically appropriate exam/evaluation, counseling and educating, placing orders, documenting clinical information in the EHR, independently interpreting results, and communicating results.   Darrin Barn, MD  Pulmonary Critical Care 07/01/2024 5:25 PM

## 2024-07-02 DIAGNOSIS — F1120 Opioid dependence, uncomplicated: Secondary | ICD-10-CM | POA: Diagnosis not present

## 2024-07-22 ENCOUNTER — Ambulatory Visit: Admitting: Physician Assistant

## 2024-07-24 ENCOUNTER — Other Ambulatory Visit: Payer: Self-pay | Admitting: Physician Assistant

## 2024-07-24 DIAGNOSIS — J42 Unspecified chronic bronchitis: Secondary | ICD-10-CM

## 2024-07-24 DIAGNOSIS — R053 Chronic cough: Secondary | ICD-10-CM

## 2024-07-31 ENCOUNTER — Encounter: Payer: Self-pay | Admitting: Physician Assistant

## 2024-07-31 ENCOUNTER — Ambulatory Visit: Payer: Self-pay | Admitting: Physician Assistant

## 2024-07-31 DIAGNOSIS — S0502XA Injury of conjunctiva and corneal abrasion without foreign body, left eye, initial encounter: Secondary | ICD-10-CM

## 2024-07-31 DIAGNOSIS — F1120 Opioid dependence, uncomplicated: Secondary | ICD-10-CM | POA: Diagnosis not present

## 2024-07-31 MED ORDER — GENTAMICIN SULFATE 0.3 % OP SOLN: 1.0000 [drp] | OPHTHALMIC | 0 refills | Status: AC

## 2024-07-31 NOTE — Progress Notes (Signed)
   Subjective: Left eye pain    Patient ID: Larry Bailey, male    DOB: Feb 01, 1986, 38 y.o.   MRN: [id number]  HPI Patient complaining of left eye pain secondary to suspected foreign body 2 days ago.  Patient wears contact lenses.  Patient denies vision loss.  Rates pain as a 3/5.  Describes pain as foreign body sensation.  Patient with contact lenses and has removed left lens.   Review of Systems Hepatic steatosis ,attention deficit, hypertension, and reactive airway.    Objective:   Physical Exam Visual acuity not taken. With patient consent topical eyedrops placed in left eye and fluorescein stain revealed corneal abrasion.  Eye was copiously irrigated and patient advised to wait 10 minutes before reinserting contact lens.       Assessment & Plan: Corneal abrasion   Patient given a prescription for gentamicin eyedrops.  Patient advised return back if no improvement or worsening complaint.

## 2024-08-01 ENCOUNTER — Encounter: Payer: Self-pay | Admitting: Physician Assistant

## 2024-08-01 ENCOUNTER — Ambulatory Visit (INDEPENDENT_AMBULATORY_CARE_PROVIDER_SITE_OTHER): Admitting: Physician Assistant

## 2024-08-01 VITALS — BP 124/88 | HR 77 | Resp 14 | Ht 71.0 in | Wt 209.4 lb

## 2024-08-01 DIAGNOSIS — K760 Fatty (change of) liver, not elsewhere classified: Secondary | ICD-10-CM

## 2024-08-01 DIAGNOSIS — F1121 Opioid dependence, in remission: Secondary | ICD-10-CM

## 2024-08-01 DIAGNOSIS — R748 Abnormal levels of other serum enzymes: Secondary | ICD-10-CM | POA: Diagnosis not present

## 2024-08-01 DIAGNOSIS — I10 Essential (primary) hypertension: Secondary | ICD-10-CM

## 2024-08-01 DIAGNOSIS — J42 Unspecified chronic bronchitis: Secondary | ICD-10-CM

## 2024-08-01 DIAGNOSIS — F17218 Nicotine dependence, cigarettes, with other nicotine-induced disorders: Secondary | ICD-10-CM

## 2024-08-01 DIAGNOSIS — Z720 Tobacco use: Secondary | ICD-10-CM

## 2024-08-01 NOTE — Progress Notes (Signed)
 Established patient visit  Patient: Larry Bailey   DOB: [birth year]   37 y.o. Male  MRN: [id number] Visit Date: 08/01/2024  Today's healthcare provider: Jolynn Spencer, PA-C   Chief Complaint  Patient presents with   Medical Management of Chronic Issues   Hypertension    Pt did not pick up BP med. Has not been taking.   Subjective     HPI     Hypertension    Additional comments: Pt did not pick up BP med. Has not been taking.      Last edited by Wilfred Hargis RAMAN, CMA on 08/01/2024  2:53 PM.       Discussed the use of AI scribe software for clinical note transcription with the patient, who gave verbal consent to proceed.  History of Present Illness Larry Bailey is a 38 year old male with hypertension and chronic bronchitis who presents for follow-up on blood pressure management and liver study participation.  He has not taken his hypertension medication since August due to not picking it up from the pharmacy. He has made lifestyle changes, including dietary modifications and reducing smoking to about one pack per day.  He is participating in a liver study at [facility], which included blood work and an abdominal ultrasound on October 2nd. He has no symptoms such as jaundice, right upper quadrant pain, dark urine, or pale stools. Previous tests for hepatitis A, B, and C were normal.  He has chronic bronchitis with improved symptoms and continues to use Symbicort  and albuterol  as needed. He also has asthma and COPD, currently using Symbicort  without issues. He reports no recent abdominal pain or swelling.       06/10/2024    9:38 AM 05/21/2024    8:30 AM 09/22/2023    3:16 PM  Depression screen PHQ 2/9  Decreased Interest 0 0 0  Down, Depressed, Hopeless 0 0 0  PHQ - 2 Score 0 0 0  Altered sleeping 0 0   Tired, decreased energy 0 0   Change in appetite 0 0   Feeling bad or failure about yourself  0 0   Trouble concentrating 0 0   Moving slowly or fidgety/restless 0 0    Suicidal thoughts 0 0   PHQ-9 Score 0 0   Difficult doing work/chores Not difficult at all        06/10/2024    9:38 AM 05/21/2024    8:30 AM 09/22/2023    3:16 PM  GAD 7 : Generalized Anxiety Score  Nervous, Anxious, on Edge 0 0 0  Control/stop worrying 0 0 0  Worry too much - different things 0 0 0  Trouble relaxing 0 0 0  Restless 0 0 0  Easily annoyed or irritable 0 0 0  Afraid - awful might happen 0 0 0  Total GAD 7 Score 0 0 0  Anxiety Difficulty Not difficult at all  Not difficult at all    Medications: Outpatient Medications Prior to Visit  Medication Sig   albuterol  (VENTOLIN  HFA) 108 (90 Base) MCG/ACT inhaler Inhale 2 puffs into the lungs every 6 (six) hours as needed for wheezing or shortness of breath.   budesonide -formoterol  (SYMBICORT ) 160-4.5 MCG/ACT inhaler Inhale 2 puffs into the lungs in the morning and at bedtime.   buprenorphine-naloxone (SUBOXONE) 8-2 mg SUBL SL tablet 1 tablet 2 (two) times daily.   fluticasone  (FLONASE ) 50 MCG/ACT nasal spray Place 2 sprays into both nostrils daily.   gentamicin (GARAMYCIN) 0.3 % ophthalmic  solution Place 1 drop into the left eye every 4 (four) hours.   nicotine  (NICODERM CQ  - DOSED IN MG/24 HOURS) 14 mg/24hr patch Place 1 patch (14 mg total) onto the skin daily.   nicotine  polacrilex (NICOTINE  MINI) 2 MG lozenge Take 1 lozenge (2 mg total) by mouth as needed for smoking cessation.   valsartan  (DIOVAN ) 40 MG tablet Take 1 tablet (40 mg total) by mouth daily. (Patient not taking: Reported on 08/01/2024)   No facility-administered medications prior to visit.    Review of Systems All negative Except see HPI       Objective    BP 124/88   Pulse 77   Resp 14   Ht 5' 11 (1.803 m)   Wt 209 lb 6.4 oz (95 kg)   SpO2 99%   BMI 29.21 kg/m     Physical Exam Vitals reviewed.  Constitutional:      General: He is not in acute distress.    Appearance: Normal appearance. He is not diaphoretic.  HENT:     Head:  Normocephalic and atraumatic.  Eyes:     General: No scleral icterus.    Conjunctiva/sclera: Conjunctivae normal.  Cardiovascular:     Rate and Rhythm: Normal rate and regular rhythm.     Pulses: Normal pulses.     Heart sounds: Normal heart sounds. No murmur heard. Pulmonary:     Effort: Pulmonary effort is normal. No respiratory distress.     Breath sounds: Normal breath sounds. No wheezing or rhonchi.  Musculoskeletal:     Cervical back: Neck supple.     Right lower leg: No edema.     Left lower leg: No edema.  Lymphadenopathy:     Cervical: No cervical adenopathy.  Skin:    General: Skin is warm and dry.     Findings: No rash.  Neurological:     Mental Status: He is alert and oriented to person, place, and time. Mental status is at baseline.  Psychiatric:        Mood and Affect: Mood normal.        Behavior: Behavior normal.      No results found for any visits on 08/01/24.    Assessment & Plan Essential hypertension Chronic Hypertension well-controlled without medication.  Previously was prescribed valsartan  40  blood pressure slightly elevated but acceptable. Lifestyle modifications implemented. - Monitor blood pressure at home, morning and evening. - Follow-up in three months to reassess blood pressure. - Consider restarting medication if readings exceed 130/80 mmHg and communicate with provider.  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 Symptoms improved. Imaging shows clear lungs and normal heart. - Continue Symbicort  160/and albuterol .  Nicotine  dependence, current daily smoker Chronic  reduced smoking to 1 pack per day. Continued smoking risks health and management of hypertension and COPD. Cessation advised On NicoDerm patches and lozenges Will revisit  Opioid dependence on maintenance therapy On Suboxone for opioid dependence. Advised against phentermine due to  interaction risks and impact on blood pressure and CNS pathways. - Continue Suboxone 8/2 mg maintenance therapy. - Avoid phentermine or similar weight loss medications. Will follow-up  Fatty liver Abnormal liver enzymes Follow-up with [facility] health/GI Labs ordered reviewed from [facility] On most recent CMP from 06/14/2024 liver enzyme was still elevated US  abdomen from 07/18/2024 showed Subcentimeter gallbladder polyp, no follow-up is indicated.  Continue follow-up with [facility] health FibroScan showed 0-1 fibrosis and  the history of steatosis A 1-2-year follow-up study was recommended  No orders of the defined types were placed in this encounter.   No follow-ups on file.   The patient was advised to call back or seek an in-person evaluation if the symptoms worsen or if the condition fails to improve as anticipated.  I discussed the assessment and treatment plan with the patient. The patient was provided an opportunity to ask questions and all were answered. The patient agreed with the plan and demonstrated an understanding of the instructions.  I, Bow Buntyn, PA-C have reviewed all documentation for this visit. The documentation on 08/01/2024  for the exam, diagnosis, procedures, and orders are all accurate and complete.  Jolynn Spencer, [facility], MMS [facility] [phone] (phone) [phone] (fax)  [facility]

## 2024-08-28 DIAGNOSIS — F1120 Opioid dependence, uncomplicated: Secondary | ICD-10-CM | POA: Diagnosis not present

## 2024-09-30 DIAGNOSIS — F1120 Opioid dependence, uncomplicated: Secondary | ICD-10-CM | POA: Diagnosis not present

## 2024-11-17 NOTE — Progress Notes (Unsigned)
 " Established patient visit  Patient: Larry Bailey   DOB: 1986-02-16   39 y.o. Male  MRN: [id number] Visit Date: 11/22/2024  Today's healthcare provider: Jolynn Spencer, PA-C   No chief complaint on file.  Subjective       Discussed the use of AI scribe software for clinical note transcription with the patient, who gave verbal consent to proceed.  History of Present Illness        06/10/2024    9:38 AM 05/21/2024    8:30 AM 09/22/2023    3:16 PM  Depression screen PHQ 2/9  Decreased Interest 0 0 0  Down, Depressed, Hopeless 0 0 0  PHQ - 2 Score 0 0 0  Altered sleeping 0 0   Tired, decreased energy 0 0   Change in appetite 0 0   Feeling bad or failure about yourself  0 0   Trouble concentrating 0 0   Moving slowly or fidgety/restless 0 0   Suicidal thoughts 0 0   PHQ-9 Score 0  0    Difficult doing work/chores Not difficult at all       Data saved with a previous flowsheet row definition      06/10/2024    9:38 AM 05/21/2024    8:30 AM 09/22/2023    3:16 PM  GAD 7 : Generalized Anxiety Score  Nervous, Anxious, on Edge 0  0  0   Control/stop worrying 0  0  0   Worry too much - different things 0  0  0   Trouble relaxing 0  0  0   Restless 0  0  0   Easily annoyed or irritable 0  0  0   Afraid - awful might happen 0  0  0   Total GAD 7 Score 0 0 0  Anxiety Difficulty Not difficult at all  Not difficult at all     Data saved with a previous flowsheet row definition    Medications: Show/hide medication list[1]  Review of Systems  All other systems reviewed and are negative.  All negative Except see HPI   {Insert previous labs (optional):23779} {See past labs  Heme  Chem  Endocrine  Serology  Results Review (optional):1}   Objective    There were no vitals taken for this visit. {Insert last BP/Wt (optional):23777}{See vitals history (optional):1}   Physical Exam Vitals reviewed.  Constitutional:      General: He is not in acute distress.     Appearance: Normal appearance. He is not diaphoretic.  HENT:     Head: Normocephalic and atraumatic.  Eyes:     General: No scleral icterus.    Conjunctiva/sclera: Conjunctivae normal.  Cardiovascular:     Rate and Rhythm: Normal rate and regular rhythm.     Pulses: Normal pulses.     Heart sounds: Normal heart sounds. No murmur heard. Pulmonary:     Effort: Pulmonary effort is normal. No respiratory distress.     Breath sounds: Normal breath sounds. No wheezing or rhonchi.  Musculoskeletal:     Cervical back: Neck supple.     Right lower leg: No edema.     Left lower leg: No edema.  Lymphadenopathy:     Cervical: No cervical adenopathy.  Skin:    General: Skin is warm and dry.     Findings: No rash.  Neurological:     Mental Status: He is alert and oriented to person, place, and time. Mental status is at baseline.  Psychiatric:        Mood and Affect: Mood normal.        Behavior: Behavior normal.     No results found for any visits on 11/22/24.      Assessment and Plan Assessment & Plan     No orders of the defined types were placed in this encounter.   No follow-ups on file.   The patient was advised to call back or seek an in-person evaluation if the symptoms worsen or if the condition fails to improve as anticipated.  I discussed the assessment and treatment plan with the patient. The patient was provided an opportunity to ask questions and all were answered. The patient agreed with the plan and demonstrated an understanding of the instructions.  I, Gus Littler, PA-C have reviewed all documentation for this visit. The documentation on 11/22/2024  for the exam, diagnosis, procedures, and orders are all accurate and complete.  Jolynn Spencer, [facility], MMS [facility] [phone] (phone) [phone] (fax)  [facility]     [1] Outpatient Medications Prior to Visit  Medication Sig   albuterol  (VENTOLIN  HFA) 108 (90 Base) MCG/ACT  inhaler Inhale 2 puffs into the lungs every 6 (six) hours as needed for wheezing or shortness of breath.   budesonide -formoterol  (SYMBICORT ) 160-4.5 MCG/ACT inhaler Inhale 2 puffs into the lungs in the morning and at bedtime.   buprenorphine-naloxone (SUBOXONE) 8-2 mg SUBL SL tablet 1 tablet 2 (two) times daily.   fluticasone  (FLONASE ) 50 MCG/ACT nasal spray Place 2 sprays into both nostrils daily.   gentamicin  (GARAMYCIN ) 0.3 % ophthalmic solution Place 1 drop into the left eye every 4 (four) hours.   nicotine  (NICODERM CQ  - DOSED IN MG/24 HOURS) 14 mg/24hr patch Place 1 patch (14 mg total) onto the skin daily.   nicotine  polacrilex (NICOTINE  MINI) 2 MG lozenge Take 1 lozenge (2 mg total) by mouth as needed for smoking cessation.   valsartan  (DIOVAN ) 40 MG tablet Take 1 tablet (40 mg total) by mouth daily. (Patient not taking: Reported on 08/01/2024)   No facility-administered medications prior to visit.  "

## 2024-11-22 ENCOUNTER — Ambulatory Visit: Admitting: Physician Assistant

## 2024-12-27 ENCOUNTER — Ambulatory Visit: Admitting: Physician Assistant

## 2025-01-23 ENCOUNTER — Encounter: Admitting: Physician Assistant
